# Patient Record
Sex: Male | Born: 1935 | Race: White | Hispanic: No | Marital: Married | Smoking: Never smoker
Health system: Southern US, Community
[De-identification: ages and names within clinical notes are randomized; demographics above are authoritative.]

## PROBLEM LIST (undated history)

## (undated) DIAGNOSIS — J61 Pneumoconiosis due to asbestos and other mineral fibers: Secondary | ICD-10-CM

## (undated) DIAGNOSIS — I2510 Atherosclerotic heart disease of native coronary artery without angina pectoris: Secondary | ICD-10-CM

## (undated) DIAGNOSIS — R42 Dizziness and giddiness: Secondary | ICD-10-CM

## (undated) DIAGNOSIS — N289 Disorder of kidney and ureter, unspecified: Secondary | ICD-10-CM

## (undated) DIAGNOSIS — E119 Type 2 diabetes mellitus without complications: Secondary | ICD-10-CM

## (undated) DIAGNOSIS — IMO0002 Reserved for concepts with insufficient information to code with codable children: Secondary | ICD-10-CM

## (undated) DIAGNOSIS — I428 Other cardiomyopathies: Secondary | ICD-10-CM

## (undated) DIAGNOSIS — M1990 Unspecified osteoarthritis, unspecified site: Secondary | ICD-10-CM

## (undated) DIAGNOSIS — I080 Rheumatic disorders of both mitral and aortic valves: Secondary | ICD-10-CM

## (undated) DIAGNOSIS — I472 Ventricular tachycardia: Secondary | ICD-10-CM

## (undated) DIAGNOSIS — H833X9 Noise effects on inner ear, unspecified ear: Secondary | ICD-10-CM

## (undated) DIAGNOSIS — I079 Rheumatic tricuspid valve disease, unspecified: Secondary | ICD-10-CM

## (undated) DIAGNOSIS — I4891 Unspecified atrial fibrillation: Secondary | ICD-10-CM

## (undated) DIAGNOSIS — G4733 Obstructive sleep apnea (adult) (pediatric): Secondary | ICD-10-CM

## (undated) DIAGNOSIS — K219 Gastro-esophageal reflux disease without esophagitis: Secondary | ICD-10-CM

## (undated) DIAGNOSIS — E039 Hypothyroidism, unspecified: Secondary | ICD-10-CM

## (undated) DIAGNOSIS — E785 Hyperlipidemia, unspecified: Secondary | ICD-10-CM

## (undated) HISTORY — DX: Ventricular tachycardia: I47.2

## (undated) HISTORY — DX: Unspecified osteoarthritis, unspecified site: M19.90

## (undated) HISTORY — DX: Other cardiomyopathies: I42.8

## (undated) HISTORY — DX: Pneumoconiosis due to asbestos and other mineral fibers: J61

## (undated) HISTORY — DX: Rheumatic disorders of both mitral and aortic valves: I08.0

## (undated) HISTORY — DX: Hypothyroidism, unspecified: E03.9

## (undated) HISTORY — DX: Dizziness and giddiness: R42

## (undated) HISTORY — DX: Obstructive sleep apnea (adult) (pediatric): G47.33

## (undated) HISTORY — DX: Unspecified atrial fibrillation: I48.91

## (undated) HISTORY — DX: Disorder of kidney and ureter, unspecified: N28.9

## (undated) HISTORY — DX: Hyperlipidemia, unspecified: E78.5

## (undated) HISTORY — DX: Atherosclerotic heart disease of native coronary artery without angina pectoris: I25.10

## (undated) HISTORY — DX: Reserved for concepts with insufficient information to code with codable children: IMO0002

## (undated) HISTORY — DX: Rheumatic tricuspid valve disease, unspecified: I07.9

## (undated) HISTORY — DX: Type 2 diabetes mellitus without complications: E11.9

## (undated) HISTORY — DX: Noise effects on inner ear, unspecified ear: H83.3X9

## (undated) HISTORY — DX: Gastro-esophageal reflux disease without esophagitis: K21.9

---

## 2009-11-18 ENCOUNTER — Ambulatory Visit: Payer: Self-pay | Admitting: Internal Medicine

## 2009-11-18 ENCOUNTER — Inpatient Hospital Stay (HOSPITAL_COMMUNITY): Admission: RE | Admit: 2009-11-18 | Discharge: 2009-11-28 | Payer: Self-pay | Admitting: Orthopedic Surgery

## 2009-11-18 ENCOUNTER — Ambulatory Visit: Payer: Self-pay | Admitting: Pulmonary Disease

## 2009-11-18 HISTORY — PX: OTHER SURGICAL HISTORY: SHX169

## 2009-11-20 ENCOUNTER — Encounter: Payer: Self-pay | Admitting: Internal Medicine

## 2009-11-20 ENCOUNTER — Ambulatory Visit: Payer: Self-pay | Admitting: Physical Medicine & Rehabilitation

## 2009-11-21 ENCOUNTER — Encounter: Payer: Self-pay | Admitting: Internal Medicine

## 2009-11-25 ENCOUNTER — Encounter: Payer: Self-pay | Admitting: Internal Medicine

## 2009-11-28 ENCOUNTER — Encounter: Payer: Self-pay | Admitting: Internal Medicine

## 2009-11-29 ENCOUNTER — Encounter: Payer: Self-pay | Admitting: Internal Medicine

## 2009-12-11 ENCOUNTER — Encounter: Payer: Self-pay | Admitting: Internal Medicine

## 2009-12-11 ENCOUNTER — Ambulatory Visit: Payer: Self-pay

## 2010-02-20 ENCOUNTER — Encounter: Payer: Self-pay | Admitting: Internal Medicine

## 2010-02-26 ENCOUNTER — Telehealth: Payer: Self-pay | Admitting: Internal Medicine

## 2010-02-27 ENCOUNTER — Encounter: Payer: Self-pay | Admitting: Internal Medicine

## 2010-03-03 DIAGNOSIS — J61 Pneumoconiosis due to asbestos and other mineral fibers: Secondary | ICD-10-CM | POA: Insufficient documentation

## 2010-03-03 DIAGNOSIS — M1990 Unspecified osteoarthritis, unspecified site: Secondary | ICD-10-CM

## 2010-03-03 DIAGNOSIS — N289 Disorder of kidney and ureter, unspecified: Secondary | ICD-10-CM | POA: Insufficient documentation

## 2010-03-03 DIAGNOSIS — I079 Rheumatic tricuspid valve disease, unspecified: Secondary | ICD-10-CM

## 2010-03-03 DIAGNOSIS — E785 Hyperlipidemia, unspecified: Secondary | ICD-10-CM

## 2010-03-03 DIAGNOSIS — I428 Other cardiomyopathies: Secondary | ICD-10-CM

## 2010-03-03 DIAGNOSIS — I2510 Atherosclerotic heart disease of native coronary artery without angina pectoris: Secondary | ICD-10-CM

## 2010-03-03 DIAGNOSIS — G4733 Obstructive sleep apnea (adult) (pediatric): Secondary | ICD-10-CM

## 2010-03-03 DIAGNOSIS — E119 Type 2 diabetes mellitus without complications: Secondary | ICD-10-CM

## 2010-03-03 DIAGNOSIS — I080 Rheumatic disorders of both mitral and aortic valves: Secondary | ICD-10-CM | POA: Insufficient documentation

## 2010-03-03 DIAGNOSIS — K219 Gastro-esophageal reflux disease without esophagitis: Secondary | ICD-10-CM | POA: Insufficient documentation

## 2010-03-03 DIAGNOSIS — I4891 Unspecified atrial fibrillation: Secondary | ICD-10-CM

## 2010-03-03 DIAGNOSIS — E039 Hypothyroidism, unspecified: Secondary | ICD-10-CM

## 2010-03-03 DIAGNOSIS — IMO0002 Reserved for concepts with insufficient information to code with codable children: Secondary | ICD-10-CM | POA: Insufficient documentation

## 2010-03-03 HISTORY — DX: Atherosclerotic heart disease of native coronary artery without angina pectoris: I25.10

## 2010-03-04 ENCOUNTER — Ambulatory Visit: Payer: Self-pay | Admitting: Internal Medicine

## 2010-03-04 DIAGNOSIS — Z95810 Presence of automatic (implantable) cardiac defibrillator: Secondary | ICD-10-CM

## 2010-03-07 ENCOUNTER — Encounter: Payer: Self-pay | Admitting: Internal Medicine

## 2010-03-13 ENCOUNTER — Encounter: Payer: Self-pay | Admitting: Internal Medicine

## 2010-03-19 ENCOUNTER — Encounter: Payer: Self-pay | Admitting: Internal Medicine

## 2010-03-24 ENCOUNTER — Ambulatory Visit: Payer: Self-pay | Admitting: Internal Medicine

## 2010-03-24 ENCOUNTER — Ambulatory Visit (HOSPITAL_COMMUNITY): Admission: RE | Admit: 2010-03-24 | Discharge: 2010-03-24 | Payer: Self-pay | Admitting: Internal Medicine

## 2010-03-24 ENCOUNTER — Encounter: Payer: Self-pay | Admitting: Internal Medicine

## 2010-03-28 ENCOUNTER — Encounter: Payer: Self-pay | Admitting: Internal Medicine

## 2010-05-05 ENCOUNTER — Inpatient Hospital Stay (HOSPITAL_COMMUNITY): Admission: RE | Admit: 2010-05-05 | Discharge: 2010-05-09 | Payer: Self-pay | Admitting: Orthopedic Surgery

## 2010-06-03 ENCOUNTER — Telehealth: Payer: Self-pay | Admitting: Internal Medicine

## 2010-06-04 ENCOUNTER — Encounter: Payer: Self-pay | Admitting: Internal Medicine

## 2010-06-05 ENCOUNTER — Ambulatory Visit: Payer: Self-pay | Admitting: Internal Medicine

## 2010-06-09 ENCOUNTER — Encounter: Payer: Self-pay | Admitting: Internal Medicine

## 2010-06-11 ENCOUNTER — Encounter: Payer: Self-pay | Admitting: Internal Medicine

## 2010-06-19 ENCOUNTER — Encounter: Payer: Self-pay | Admitting: Internal Medicine

## 2010-06-26 ENCOUNTER — Ambulatory Visit: Payer: Self-pay | Admitting: Internal Medicine

## 2010-06-26 DIAGNOSIS — I472 Ventricular tachycardia, unspecified: Secondary | ICD-10-CM

## 2010-06-26 DIAGNOSIS — I4729 Other ventricular tachycardia: Secondary | ICD-10-CM

## 2010-06-26 HISTORY — DX: Other ventricular tachycardia: I47.29

## 2010-06-26 HISTORY — DX: Ventricular tachycardia, unspecified: I47.20

## 2010-06-26 HISTORY — DX: Ventricular tachycardia: I47.2

## 2010-07-02 ENCOUNTER — Encounter: Payer: Self-pay | Admitting: Internal Medicine

## 2010-07-29 ENCOUNTER — Telehealth: Payer: Self-pay | Admitting: Internal Medicine

## 2010-09-09 ENCOUNTER — Ambulatory Visit: Payer: Self-pay | Admitting: Internal Medicine

## 2010-09-12 ENCOUNTER — Encounter: Payer: Self-pay | Admitting: Internal Medicine

## 2010-10-01 ENCOUNTER — Encounter: Payer: Self-pay | Admitting: Internal Medicine

## 2010-10-24 ENCOUNTER — Ambulatory Visit
Admission: RE | Admit: 2010-10-24 | Discharge: 2010-10-24 | Payer: Self-pay | Source: Home / Self Care | Attending: Internal Medicine | Admitting: Internal Medicine

## 2010-10-24 ENCOUNTER — Encounter: Payer: Self-pay | Admitting: Internal Medicine

## 2010-11-18 NOTE — Letter (Signed)
Summary: [facility] Progress Note  [facility] Progress Note   Imported By: Roderic Ovens 04/09/2010 16:00:10  _____________________________________________________________________  External Attachment:    Type:   Image     Comment:   External Document

## 2010-11-18 NOTE — Cardiovascular Report (Signed)
Summary: Office Visit Remote   Office Visit Remote   Imported By: Roderic Ovens 07/08/2010 13:55:23  _____________________________________________________________________  External Attachment:    Type:   Image     Comment:   External Document

## 2010-11-18 NOTE — Cardiovascular Report (Signed)
Summary: Office Visit   Office Visit   Imported By: Roderic Ovens 07/07/2010 15:44:24  _____________________________________________________________________  External Attachment:    Type:   Image     Comment:   External Document

## 2010-11-18 NOTE — Letter (Signed)
Summary: [facility]  [facility]   Imported By: Marylou Mccoy 07/10/2010 16:05:42  _____________________________________________________________________  External Attachment:    Type:   Image     Comment:   External Document

## 2010-11-18 NOTE — Assessment & Plan Note (Signed)
Summary: pc2/jml   Visit Type:  Pacemaker check Primary Provider:  vICKY iNGLEDUE  CC:  EDEMA/FEET/HANDS.  History of Present Illness: Jeremy Cobb returns today for followup.  He is a 77 yo man from [place], [place] who has a h/o knee replacement complicated by sustained VT arrest for which he was successfully defibrillated.  He is s/p ICD and returns for followup.  He denies c/p or sob.  He is considering another knee replacement surgery.  He denies c/p but does admit to sob with exertion.  No intercurrent ICD therapies.  Problems Prior to Update: 1)  Coronary Artery Disease  (ICD-414.00) 2)  Atrial Fibrillation  (ICD-427.31) 3)  Cardiomyopathy  (ICD-425.4) 4)  Tricuspid Regurgitation, Mild  (ICD-397.0) 5)  Mitral Regurgitation, Mild  (ICD-396.3) 6)  Hyperlipidemia  (ICD-272.4) 7)  Gerd  (ICD-530.81) 8)  Diabetes Mellitus, Type II  (ICD-250.00) 9)  Renal Disease, Chronic  (ICD-593.9) 10)  Hypothyroidism  (ICD-244.9) 11)  Obstructive Sleep Apnea  (ICD-327.23) 12)  Osteoarthritis  (ICD-715.90) 13)  Asbestosis  (ICD-501) 14)  Degenerative Disc Disease  (ICD-722.6)  Current Medications (verified): 1)  Aspirin 81 Mg Tbec (Aspirin) .... Take One Tablet By Mouth Daily 2)  Amiodarone Hcl 200 Mg Tabs (Amiodarone Hcl) .... Two By Mouth Daily 3)  Digoxin 0.125 Mg Tabs (Digoxin) .... One By Mouth Daily 4)  Furosemide 20 Mg Tabs (Furosemide) .... One By Mouth Daily 5)  Simvastatin 80 Mg Tabs (Simvastatin) .Marland Kitchen.. 1 Tab At Bedtime 6)  Aspir-Low 81 Mg Tbec (Aspirin) .... One By Mouth Daily 7)  Glipizide Xl 5 Mg Xr24h-Tab (Glipizide) .... One By Mouth Two Times A Day 8)  Hydrocodone-Acetaminophen 5-325 Mg Tabs (Hydrocodone-Acetaminophen) .... As Needed 9)  Levothroid 25 Mcg Tabs (Levothyroxine Sodium) .... One By Mouth Daily 10)  Omeprazole 40 Mg Cpdr (Omeprazole) .... One By Mouth Two Times A Day 11)  Klor-Con 20 Meq Pack (Potassium Chloride) .... One By Mouth Daily 12)  Warfarin Sodium 2 Mg Tabs  (Warfarin Sodium) .... As Directed  Allergies (verified): No Known Drug Allergies  Family History: n/c  Social History: Married no ETOH or Tobacco abuse.  Review of Systems  The patient denies chest pain, syncope, dyspnea on exertion, and peripheral edema.    Vital Signs:  Patient profile:   77 year old male Height:      72 inches Weight:      219 pounds BMI:     29.81 Pulse rate:   82 / minute Pulse rhythm:   regular BP sitting:   140 / 90  (left arm) Cuff size:   regular  Vitals Entered By: Danielle Rankin, CMA (Mar 04, 2010 12:15 PM)  Physical Exam  General:  Well developed, well nourished, in no acute distress.  HEENT: normal Neck: supple. No JVD. Carotids 2+ bilaterally no bruits Cor:I IRRR no rubs, gallops or murmur Lungs: CTA with no wheezes or rhonchi.  Well healed ICD incision Ab: soft, nontender. nondistended. No HSM. Good bowel sounds Ext: warm. no cyanosis, clubbing or edema Neuro: alert and oriented. Grossly nonfocal. affect pleasant     ICD Specifications Following MD:  Lewayne Bunting, MD     ICD Vendor:  Medtronic     ICD Model Number:  D284DRG     ICD Serial Number:  [id number] ICD DOI:  11/27/2009     ICD Implanting MD:  Lewayne Bunting, MD  Lead 1:    Location: RA     DOI: 11/27/2009     Model #:  [id number]     Serial #: [id number]     Status: active Lead 2:    Location: RV     DOI: 11/27/2009     Model #: [id number]     Serial #: [id number]     Status: active  Indications::  VT arrest   ICD Follow Up Remote Check?  No Battery Voltage:  3.18 V     Charge Time:  8.2 seconds     Underlying rhythm:  A-fib ICD Dependent:  No       ICD Device Measurements Atrium:  Amplitude: 0.8 mV, Impedance: 646 ohms,  Right Ventricle:  Amplitude: 10.3 mV, Impedance: 627 ohms, Threshold: 1.0 V at 0.4 msec Shock Impedance: 46/61 ohms   Episodes MS Episodes:  1     Percent Mode Switch:  100%     Coumadin:  Yes Shock:  0     ATP:  0     Nonsustained:  0     Atrial Pacing:  0.3%      Ventricular Pacing:  37.8%  Brady Parameters Mode DDD     Lower Rate Limit:  40     Upper Rate Limit 130 PAV 180     Sensed AV Delay:  150  Tachy Zones VF:  200     VT:  240     VT1:  176     Next Remote Date:  06/05/2010     Next Cardiology Appt Due:  11/19/2010 Tech Comments:  RV 2.4@0 .4.  A-fib today, + coumadin, ventricular rates controlled.  [facility] transmissions every 3 months.  RO 2/12 with Dr. Ladona Ridgel. Altha Harm, LPN  Mar 04, 2010 12:40 PM  MD Comments:  Agree with above.  Impression & Recommendations:  Problem # 1:  ATRIAL FIBRILLATION (ICD-427.31) The patient has symptomatic atrial fibrillation.  I have recommended that the patient increase his amiodarone to 400 mg daily and we schedue DCCV once we have documented 3 weeks of therapeutic INR's. His updated medication list for this problem includes:    Aspirin 81 Mg Tbec (Aspirin) .Marland Kitchen... Take one tablet by mouth daily    Amiodarone Hcl 200 Mg Tabs (Amiodarone hcl) .Marland Kitchen..Marland Kitchen Two by mouth daily    Digoxin 0.125 Mg Tabs (Digoxin) ..... One by mouth daily    Aspir-low 81 Mg Tbec (Aspirin) ..... One by mouth daily    Warfarin Sodium 2 Mg Tabs (Warfarin sodium) .Marland Kitchen... As directed  Problem # 2:  CORONARY ARTERY DISEASE (ICD-414.00) He denies anginal symptoms.  Will continue current meds. His updated medication list for this problem includes:    Aspirin 81 Mg Tbec (Aspirin) .Marland Kitchen... Take one tablet by mouth daily    Aspir-low 81 Mg Tbec (Aspirin) ..... One by mouth daily    Warfarin Sodium 2 Mg Tabs (Warfarin sodium) .Marland Kitchen... As directed  Problem # 3:  AUTOMATIC IMPLANTABLE CARDIAC DEFIBRILLATOR SITU (ICD-V45.02) His device is working normally and he has not had any additional VT.  Will followup.  Problem # 4:  UNSPECIFIED PRE-OPERATIVE EXAMINATION (ICD-V72.84) I have discussed his pending knee replacement surgery.  Overall he is low risk.  I have discussed proceeding with surgery and think he would be better off if he were in  NSR.  Patient Instructions: 1)  Your physician has recommended that you have a cardioversion (DCCV).  Electrical cardioversion uses a jolt of electricity to your heart either through paddles or wired patches attached to your chest. This is a controlled, usually prescheduled, procedure. Defibrillation is done under  light anesthesia in the hospital, and you usually go home the day of the procedure. This is done to get your heart back into a normal rhythm. You are not awake for the procedure. Please see the instruction sheet given to you today. 2)  Will set up for 03/24/10 around 2pm  Will have to be at the hospital at 11:30am Nothing to eat or drink after midnight 3)  Your physician has recommended you make the following change in your medication: increase your Amiodarone to 200mg  2 tablets daily

## 2010-11-18 NOTE — Procedures (Signed)
Summary: wound check   Current Medications (verified): 1)  Apap 325 Mg Tabs (Acetaminophen) .... As Needed 2)  Amiodarone Hcl 200 Mg Tabs (Amiodarone Hcl) .... Two By Mouth Daily 3)  Digoxin 0.125 Mg Tabs (Digoxin) .... One By Mouth Daily 4)  Furosemide 20 Mg Tabs (Furosemide) .... One By Mouth Daily 5)  Crestor 40 Mg Tabs (Rosuvastatin Calcium) .... One By Mouth Daily 6)  Aspir-Low 81 Mg Tbec (Aspirin) .... One By Mouth Daily 7)  Glipizide Xl 5 Mg Xr24h-Tab (Glipizide) .... One By Mouth Two Times A Day 8)  Hydrocodone-Acetaminophen 5-325 Mg Tabs (Hydrocodone-Acetaminophen) .... As Needed 9)  Levothroid 25 Mcg Tabs (Levothyroxine Sodium) .... One By Mouth Daily 10)  Omeprazole 40 Mg Cpdr (Omeprazole) .... One By Mouth Two Times A Day 11)  Klor-Con 20 Meq Pack (Potassium Chloride) .... One By Mouth Daily  Allergies (verified): No Known Drug Allergies   ICD Specifications Following MD:  Lewayne Bunting, MD     ICD Vendor:  Medtronic     ICD Model Number:  D284DRG     ICD Serial Number:  [id number] ICD DOI:  11/27/2009     ICD Implanting MD:  Lewayne Bunting, MD  Lead 1:    Location: RA     DOI: 11/27/2009     Model #: 2536     Serial #: [id number]     Status: active Lead 2:    Location: RV     DOI: 11/27/2009     Model #: 9563     Serial #: [id number]     Status: active  Indications::  VT arrest   ICD Follow Up Remote Check?  No Battery Voltage:  3.17 V     Charge Time:  8.2 seconds     Underlying rhythm:  A-fib ICD Dependent:  No       ICD Device Measurements Atrium:  Amplitude: 2.0 mV, Impedance: 627 ohms,  Right Ventricle:  Amplitude: 13.5 mV, Impedance: 532 ohms, Threshold: 0.75 V at 0.4 msec Shock Impedance: 41/52 ohms   Episodes Percent Mode Switch:  100%     Coumadin:  Yes Shock:  0     ATP:  0     Nonsustained:  0     Atrial Pacing:  0.2%     Ventricular Pacing:  80.1%  Brady Parameters Mode DDD     Lower Rate Limit:  40     Upper Rate Limit 130 PAV 180     Sensed AV  Delay:  150  Tachy Zones VF:  200     VT:  240     VT1:  176     Tech Comments:  Steri strips removed, no redness, small amt. edema noted.  Activity restrictions and device discharge instructions reviewed with the patient and he is in agreement.  No parameter changes.  A-fib with controlled ventricular rates, + coumadin not followed by [place].  ROV 3 months Dr. Ladona Ridgel. Altha Harm, LPN  December 11, 2009 12:28 PM

## 2010-11-18 NOTE — Consult Note (Signed)
Summary: [facility]   Imported By: Roderic Ovens 12/09/2009 14:24:15  _____________________________________________________________________  External Attachment:    Type:   Image     Comment:   External Document

## 2010-11-18 NOTE — Progress Notes (Signed)
Summary: refill  Phone Note Refill Request Message from:  Patient on Feb 26, 2010 9:23 AM  Refills Requested: Medication #1:  DIGOXIN 0.125 MG TABS one by mouth daily  Medication #2:  FUROSEMIDE 20 MG TABS one by mouth daily Send to [facility] [phone]  Initial call taken by: Judie Grieve,  Feb 26, 2010 9:24 AM    Prescriptions: FUROSEMIDE 20 MG TABS (FUROSEMIDE) one by mouth daily  #30 x 6   Entered by:   Laurance Flatten CMA   Authorized by:   Laren Boom, MD, [facility]   Signed by:   Laurance Flatten CMA on 02/26/2010   Method used:   Print then Give to Patient   RxID:   [id number] DIGOXIN 0.125 MG TABS (DIGOXIN) one by mouth daily  #30 x 6   Entered by:   Laurance Flatten CMA   Authorized by:   Laren Boom, MD, [facility]   Signed by:   Laurance Flatten CMA on 02/26/2010   Method used:   Print then Give to Patient   RxID:   [id number]   Appended Document: refill Called both Rx in to pharmacy.

## 2010-11-18 NOTE — Cardiovascular Report (Signed)
Summary: Office Visit   [facility]   Imported By: Roderic Ovens 12/16/2009 12:43:54  _____________________________________________________________________  External Attachment:    Type:   Image     Comment:   External Document

## 2010-11-18 NOTE — Assessment & Plan Note (Signed)
Summary: rov   Visit Type:  Follow-up Primary Provider:  vICKY iNGLEDUE   History of Present Illness: Jeremy Cobb returns today for followup.  He is a 77 yo man from [place], [place] who has a h/o knee replacement complicated by sustained VT arrest for which he was successfully defibrillated.  He is s/p ICD and returns for followup.  He denies c/p but does admit to sob with exertion.  No intercurrent ICD therapies.  The patient is interested in switching from coumadin to pradaxa.  Current Medications (verified): 1)  Aspirin 81 Mg Tbec (Aspirin) .... Take One Tablet By Mouth Daily 2)  Amiodarone Hcl 200 Mg Tabs (Amiodarone Hcl) .... Two By Mouth Daily 3)  Digoxin 0.125 Mg Tabs (Digoxin) .... One By Mouth Daily 4)  Furosemide 20 Mg Tabs (Furosemide) .... One By Mouth Daily 5)  Crestor 40 Mg Tabs (Rosuvastatin Calcium) .... At Bedtime 6)  Glipizide Xl 5 Mg Xr24h-Tab (Glipizide) .... One By Mouth Two Times A Day 7)  Oxycodone-Acetaminophen 5-325 Mg Tabs (Oxycodone-Acetaminophen) .... Uad 8)  Levothroid 50 Mcg Tabs (Levothyroxine Sodium) .... Once Daily 9)  Omeprazole 40 Mg Cpdr (Omeprazole) .... One By Mouth Two Times A Day 10)  Klor-Con 20 Meq Pack (Potassium Chloride) .... 1/2 Tab  By Mouth Daily 11)  Warfarin Sodium 2 Mg Tabs (Warfarin Sodium) .... As Directed 12)  Docusate Sodium 100 Mg Caps (Docusate Sodium) .... Two Times A Day  Allergies (verified): No Known Drug Allergies  Past History:  Past Medical History: Last updated: 03/03/2010 Current Problems:  CORONARY ARTERY DISEASE (ICD-414.00)- Nonobstructive ATRIAL FIBRILLATION (ICD-427.31) CARDIOMYOPATHY (ICD-425.4)- Presumed Nonischemic TRICUSPID REGURGITATION, MILD (ICD-397.0) MITRAL REGURGITATION, MILD (ICD-396.3) HYPERLIPIDEMIA (ICD-272.4) GERD (ICD-530.81) DIABETES MELLITUS, TYPE II (ICD-250.00) RENAL DISEASE, CHRONIC (ICD-593.9) HYPOTHYROIDISM (ICD-244.9) OBSTRUCTIVE SLEEP APNEA (ICD-327.23) OSTEOARTHRITIS  (ICD-715.90) ASBESTOSIS (ICD-501) DEGENERATIVE DISC DISEASE (ICD-722.6)  Past Surgical History: Last updated: 03/03/2010  Right total knee arthroplasty November 18, 2009 l placement of a Medtronic Maximo II DR dual-chamber AICD    serial number [id number].   Review of Systems  The patient denies chest pain, syncope, dyspnea on exertion, and peripheral edema.    Vital Signs:  Patient profile:   77 year old male Height:      72 inches Weight:      224 pounds BMI:     30.49 Pulse rate:   58 / minute BP sitting:   138 / 80  (left arm)  Vitals Entered By: Jeremy Cobb CMA (June 26, 2010 2:58 PM)  Physical Exam  General:  Well developed, well nourished, in no acute distress.  HEENT: normal Neck: supple. No JVD. Carotids 2+ bilaterally no bruits Cor:I IRRR no rubs, gallops or murmur Lungs: CTA with no wheezes or rhonchi.  Well healed ICD incision Ab: soft, nontender. nondistended. No HSM. Good bowel sounds Ext: warm. no cyanosis, clubbing or edema Neuro: alert and oriented. Grossly nonfocal. affect pleasant     ICD Specifications Following MD:  Lewayne Bunting, MD     ICD Vendor:  Medtronic     ICD Model Number:  D284DRG     ICD Serial Number:  [id number] ICD DOI:  11/27/2009     ICD Implanting MD:  Lewayne Bunting, MD  Lead 1:    Location: RA     DOI: 11/27/2009     Model #: 6962     Serial #: [id number]     Status: active Lead 2:    Location: RV     DOI: 11/27/2009  Model #: [id number]     Serial #: [id number]     Status: active  Indications::  VT arrest   ICD Follow Up Battery Voltage:  3.17 V     Charge Time:  8.1 seconds     Underlying rhythm:  SR ICD Dependent:  No       ICD Device Measurements Atrium:  Amplitude: 2.6 mV, Impedance: 532 ohms, Threshold: 1.00 V at 0.40 msec Right Ventricle:  Amplitude: 12.0 mV, Impedance: 532 ohms, Threshold: 0.75 V at 0.40 msec Shock Impedance: 40/54 ohms   Episodes MS Episodes:  0     Coumadin:  Yes Shock:  0     ATP:  0      Nonsustained:  0     Atrial Therapies:  0 Atrial Pacing:  <0.1%     Ventricular Pacing:  <0.1%  Brady Parameters Mode MVP     Lower Rate Limit:  40     Upper Rate Limit 130 PAV 180     Sensed AV Delay:  150  Tachy Zones VF:  200     VT:  240     VT1:  176     Next Remote Date:  10/02/2010     Next Cardiology Appt Due:  11/19/2010 Tech Comments:  NORMAL DEVICE FUNCTION. NO EPISODES SINCE LAST CHECK.  TURNED ON 1:1 SVT AND CHANGED MAX LEAD IMPEDANCE FOR LIA. CHANGED RA OUTPUT FROM 3.50 TO 2.00 V.  CARELINK TRANSMISSION 10-02-10. ROV IN FEB 2012 W/GT. Jeremy Cobb  June 26, 2010 3:27 PM MD Comments:  Agree with above.  Impression & Recommendations:  Problem # 1:  AUTOMATIC IMPLANTABLE CARDIAC DEFIBRILLATOR SITU (ICD-V45.02) His device is working normally.  Will recheck in several months.  Problem # 2:  VENTRICULAR TACHYCARDIA, PAROXYSMAL (ICD-427.1) He has had no additional ventricular arrhythmias on amiodarone.  Today I have asked him to decrease his dose to 300 mg daily.  Additional decrements will follow provided he maintains NSR. The following medications were removed from the medication list:    Aspir-low 81 Mg Tbec (Aspirin) ..... One by mouth daily His updated medication list for this problem includes:    Aspirin 81 Mg Tbec (Aspirin) .Marland Kitchen... Take one tablet by mouth daily    Amiodarone Hcl 200 Mg Tabs (Amiodarone hcl) ..... One and a half pills by mouth daily (150mg  daily)    Warfarin Sodium 2 Mg Tabs (Warfarin sodium) .Marland Kitchen... As directed (stop 4 days before dental procedure)  Problem # 3:  ATRIAL FIBRILLATION (ICD-427.31) He continues to maintain NSR since his cardioversion.  Continue meds as below and will plan to switch to Pradaxa. The following medications were removed from the medication list:    Aspir-low 81 Mg Tbec (Aspirin) ..... One by mouth daily His updated medication list for this problem includes:    Aspirin 81 Mg Tbec (Aspirin) .Marland Kitchen... Take one tablet by mouth  daily    Amiodarone Hcl 200 Mg Tabs (Amiodarone hcl) ..... One and a half pills by mouth daily (150mg  daily)    Digoxin 0.125 Mg Tabs (Digoxin) ..... One by mouth daily    Warfarin Sodium 2 Mg Tabs (Warfarin sodium) .Marland Kitchen... As directed (stop 4 days before dental procedure)  Patient Instructions: 1)  Your physician has recommended you make the following change in your medication: Stop coumadin 4 days before dental work. Start Pradaxa after dental work. Take Pradaxa 150mg  one pill-two times a day.  Decrease Amiodorane to 1 and 1/2 pills a day (150mg ).  2)  Your physician wants you to follow-up in:  4 months.  You will receive a reminder letter in the mail two months in advance. If you don't receive a letter, please call our office to schedule the follow-up appointment. Prescriptions: PRADAXA 150 MG CAPS (DABIGATRAN ETEXILATE MESYLATE) one tablet twice a day  #180 x 3   Entered by:   Jeremy Gladden RN   Authorized by:   Laren Boom, MD, [facility]   Signed by:   Jeremy Gladden RN on 06/26/2010   Method used:   Print then Give to Patient   RxID:   [id number]

## 2010-11-18 NOTE — Progress Notes (Signed)
Summary: need clearance to have tooth pulled  Phone Note Call from Patient Call back at [phone]    Summary of Call: Pt need dental work and need clearance Initial call taken by: Judie Grieve,  July 29, 2010 10:09 AM  Follow-up for Phone Call        pt will stop Pradaxa 3 days prior to tooth extraction per DrTaylor.  Dr Ophelia Charter office aware Dennis Bast, RN, BSN  July 29, 2010 2:07 PM

## 2010-11-18 NOTE — Miscellaneous (Signed)
Summary: Device preload  Clinical Lists Changes  Observations: Added new observation of ICD INDICATN: VT arrest (11/29/2009 8:45) Added new observation of ICDLEADSTAT2: active (11/29/2009 8:45) Added new observation of ICDLEADSER2: [id number] V (11/29/2009 8:45) Added new observation of ICDLEADMOD2: 6947  (11/29/2009 8:45) Added new observation of ICDLEADLOC2: RV  (11/29/2009 8:45) Added new observation of ICDLEADSTAT1: active  (11/29/2009 8:45) Added new observation of ICDLEADSER1: [id number]  (11/29/2009 8:45) Added new observation of ICDLEADMOD1: 5076  (11/29/2009 8:45) Added new observation of ICDLEADLOC1: RA  (11/29/2009 8:45) Added new observation of ICDLEADDOI2: 11/27/2009  (11/29/2009 8:45) Added new observation of ICDLEADDOI1: 11/27/2009  (11/29/2009 8:45) Added new observation of ICD IMP MD: Jeremy Bunting, MD  (11/29/2009 8:45) Added new observation of ICD IMPL DTE: 11/27/2009  (11/29/2009 8:45) Added new observation of ICD SERL#: [id number] H  (11/29/2009 8:45) Added new observation of ICD MODL#: D284DRG  (11/29/2009 8:45) Added new observation of ICDMANUFACTR: Medtronic  (11/29/2009 8:45) Added new observation of ICD MD: Jeremy Bunting, MD  (11/29/2009 8:45)       ICD Specifications Following MD:  Jeremy Bunting, MD     ICD Vendor:  Medtronic     ICD Model Number:  D284DRG     ICD Serial Number:  [id number] ICD DOI:  11/27/2009     ICD Implanting MD:  Jeremy Bunting, MD  Lead 1:    Location: RA     DOI: 11/27/2009     Model #: 4010     Serial #: [id number]     Status: active Lead 2:    Location: RV     DOI: 11/27/2009     Model #: 3474     Serial #: [id number]     Status: active  Indications::  VT arrest

## 2010-11-18 NOTE — Letter (Signed)
Summary: Remote Device Check  [employer], Main Office  [address]   [place], [place] [address]   Phone: [phone]  Fax: [phone]     July 02, 2010 MRN: [id number]   Jeremy Cobb [address] [place], [place]  [address]   Dear [name],   Your remote transmission was recieved and reviewed by your physician.  All diagnostics were within normal limits for you.  __X___Your next transmission is scheduled for:  09-11-10.  Please transmit at any time this day.  If you have a wireless device your transmission will be sent automatically.   Sincerely,  Vella Kohler

## 2010-11-18 NOTE — Letter (Signed)
Summary: [facility]   [facility]   Imported By: Roderic Ovens 04/14/2010 12:20:17  _____________________________________________________________________  External Attachment:    Type:   Image     Comment:   External Document

## 2010-11-18 NOTE — Progress Notes (Signed)
Summary: pt rtn call from yesterday  Phone Note Outgoing Call Call back at [facility] Phone [phone]   Caller: Patient Reason for Call: Talk to Nurse, Talk to Doctor Summary of Call: pt rtn call from yesterday regarding defib changes and he just wants to confirm time so he will be near the machine Initial call taken by: Omer Jack,  June 03, 2010 11:52 AM Summary of Call: spoke w/pt about remote check. pt understands how to send transmission. pt to call if any problems occur. Vella Kohler  June 03, 2010 3:11 PM

## 2010-11-20 NOTE — Assessment & Plan Note (Signed)
Summary: f68m   Visit Type:  4 month follow up Primary Provider:  vICKY iNGLEDUE  CC:  dizziness and swelling in left knee.  History of Present Illness: [name] returns today for followup.  He is a 77 yo man from [place], [place] who has a h/o knee replacement complicated by sustained VT arrest for which he was successfully defibrillated.  He is s/p ICD and returns for followup.  He denies c/p but does admit to sob with exertion. He also has some dizziness.  No intercurrent ICD therapies.  No palpitations.  Problems Prior to Update: 1)  Ventricular Tachycardia, Paroxysmal  (ICD-427.1) 2)  Unspecified Pre-operative Examination  (ICD-V72.84) 3)  Automatic Implantable Cardiac Defibrillator Situ  (ICD-V45.02) 4)  Coronary Artery Disease  (ICD-414.00) 5)  Atrial Fibrillation  (ICD-427.31) 6)  Cardiomyopathy  (ICD-425.4) 7)  Tricuspid Regurgitation, Mild  (ICD-397.0) 8)  Mitral Regurgitation, Mild  (ICD-396.3) 9)  Hyperlipidemia  (ICD-272.4) 10)  Gerd  (ICD-530.81) 11)  Diabetes Mellitus, Type II  (ICD-250.00) 12)  Renal Disease, Chronic  (ICD-593.9) 13)  Hypothyroidism  (ICD-244.9) 14)  Obstructive Sleep Apnea  (ICD-327.23) 15)  Osteoarthritis  (ICD-715.90) 16)  Asbestosis  (ICD-501) 17)  Degenerative Disc Disease  (ICD-722.6)  Current Medications (verified): 1)  Aspirin 81 Mg Tbec (Aspirin) .... Take One Tablet By Mouth Daily 2)  Amiodarone Hcl 200 Mg Tabs (Amiodarone Hcl) .... One and A Half Pills By Mouth Daily (150mg  Daily) 3)  Digoxin 0.125 Mg Tabs (Digoxin) .... One By Mouth Daily 4)  Furosemide 20 Mg Tabs (Furosemide) .... One By Mouth Daily 5)  Crestor 40 Mg Tabs (Rosuvastatin Calcium) .... At Bedtime 6)  Glipizide Xl 5 Mg Xr24h-Tab (Glipizide) .... One By Mouth Two Times A Day 7)  Oxycodone-Acetaminophen 5-325 Mg Tabs (Oxycodone-Acetaminophen) .... Uad 8)  Levothroid 50 Mcg Tabs (Levothyroxine Sodium) .... Once Daily 9)  Omeprazole 40 Mg Cpdr (Omeprazole) .... One By Mouth  Two Times A Day 10)  Klor-Con 20 Meq Pack (Potassium Chloride) .... 1/2 Tab  By Mouth Daily 11)  Docusate Sodium 100 Mg Caps (Docusate Sodium) .... Two Times A Day 12)  Pradaxa 150 Mg Caps (Dabigatran Etexilate Mesylate) .... One Tablet Twice A Day  Allergies (verified): No Known Drug Allergies  Past History:  Past Medical History: Last updated: 03/03/2010 Current Problems:  CORONARY ARTERY DISEASE (ICD-414.00)- Nonobstructive ATRIAL FIBRILLATION (ICD-427.31) CARDIOMYOPATHY (ICD-425.4)- Presumed Nonischemic TRICUSPID REGURGITATION, MILD (ICD-397.0) MITRAL REGURGITATION, MILD (ICD-396.3) HYPERLIPIDEMIA (ICD-272.4) GERD (ICD-530.81) DIABETES MELLITUS, TYPE II (ICD-250.00) RENAL DISEASE, CHRONIC (ICD-593.9) HYPOTHYROIDISM (ICD-244.9) OBSTRUCTIVE SLEEP APNEA (ICD-327.23) OSTEOARTHRITIS (ICD-715.90) ASBESTOSIS (ICD-501) DEGENERATIVE DISC DISEASE (ICD-722.6)  Past Surgical History: Last updated: 03/03/2010  Right total knee arthroplasty November 18, 2009 l placement of a Medtronic Maximo II DR dual-chamber AICD    serial number [id number].   Family History: Last updated: 03/04/2010 n/c  Social History: Last updated: 03/04/2010 Married no ETOH or Tobacco abuse.  Review of Systems  The patient denies chest pain, syncope, dyspnea on exertion, and peripheral edema.    Vital Signs:  Patient profile:   77 year old male Height:      72 inches Weight:      233.50 pounds BMI:     31.78 Pulse rate:   63 / minute BP sitting:   164 / 79  (left arm) Cuff size:   regular  Vitals Entered By: Caralee Ates CMA (October 24, 2010 1:15 PM)  Physical Exam  General:  Well developed, well nourished, in no acute distress.  HEENT: normal Neck: supple. No JVD. Carotids 2+ bilaterally no bruits Cor:I IRRR no rubs, gallops or murmur Lungs: CTA with no wheezes or rhonchi.  Well healed ICD incision Ab: soft, nontender. nondistended. No HSM. Good bowel sounds Ext: warm. no cyanosis, clubbing  or edema Neuro: alert and oriented. Grossly nonfocal. affect pleasant     ICD Specifications Following MD:  Lewayne Bunting, MD     ICD Vendor:  Medtronic     ICD Model Number:  [id number]     ICD Serial Number:  [id number] ICD DOI:  11/27/2009     ICD Implanting MD:  Lewayne Bunting, MD  Lead 1:    Location: RA     DOI: 11/27/2009     Model #: 1914     Serial #: [id number]     Status: active Lead 2:    Location: RV     DOI: 11/27/2009     Model #: [id number]     Serial #: [id number]     Status: active  Indications::  VT arrest   ICD Follow Up Battery Voltage:  3.17 V     Charge Time:  8.1 seconds     Underlying rhythm:  SR ICD Dependent:  No       ICD Device Measurements Atrium:  Amplitude: 2.9 mV, Impedance: 551 ohms, Threshold: 1.00 V at 0.40 msec Right Ventricle:  Amplitude: 12.8 mV, Impedance: 551 ohms, Threshold: 0.75 V at 0.40 msec Shock Impedance: 45/59 ohms   Episodes MS Episodes:  0     Percent Mode Switch:  0     Coumadin:  Yes Shock:  0     ATP:  0     Nonsustained:  0     Atrial Therapies:  0 Atrial Pacing:  <0.1%     Ventricular Pacing:  <0.1%  Brady Parameters Mode MVP     Lower Rate Limit:  40     Upper Rate Limit 130 PAV 180     Sensed AV Delay:  150  Tachy Zones VF:  200     VT:  240     VT1:  176     Next Remote Date:  01/22/2011     Next Cardiology Appt Due:  10/20/2011 Tech Comments:  NORMAL DEVICE FUNCTION.  NO EPISODES SINCE LAST CHECK. DEMONSTRATED TONES FOR PT AND PT AWARE TO CALL OFC IF HEARD.  NO CHANGES MADE. CARELINK 01-22-11 AND ROV 12 MTHS W/GT. Vella Kohler  October 24, 2010 1:34 PM MD Comments:  Agree with above.  Impression & Recommendations:  Problem # 1:  VENTRICULAR TACHYCARDIA, PAROXYSMAL (ICD-427.1) He has had no recurrent symptoms.  Will continue current meds and reduce amio to 200 mg a day. The following medications were removed from the medication list:    Warfarin Sodium 2 Mg Tabs (Warfarin sodium) .Marland Kitchen... As directed (stop 4 days before dental  procedure) His updated medication list for this problem includes:    Aspirin 81 Mg Tbec (Aspirin) .Marland Kitchen... Take one tablet by mouth daily    Amiodarone Hcl 200 Mg Tabs (Amiodarone hcl) .Marland Kitchen... Take one tablet by mouth once daily  Problem # 2:  AUTOMATIC IMPLANTABLE CARDIAC DEFIBRILLATOR SITU (ICD-V45.02) His device is working normally. Will recheck in several months.  Problem # 3:  ATRIAL FIBRILLATION (ICD-427.31) He continue to maintain NSR. He will remain on Pradaxa. The following medications were removed from the medication list:    Warfarin Sodium 2 Mg Tabs (Warfarin sodium) .Marland Kitchen... As directed (stop 4  days before dental procedure) His updated medication list for this problem includes:    Aspirin 81 Mg Tbec (Aspirin) .Marland Kitchen... Take one tablet by mouth daily    Amiodarone Hcl 200 Mg Tabs (Amiodarone hcl) .Marland Kitchen... Take one tablet by mouth once daily    Digoxin 0.125 Mg Tabs (Digoxin) ..... One by mouth daily  Patient Instructions: 1)  Decrease amiodarone to 200mg  one tablet by mouth once daily. 2)  Your physician wants you to follow-up in: 6 months with Dr. Ladona Ridgel.  You will receive a reminder letter in the mail two months in advance. If you don't receive a letter, please call our office to schedule the follow-up appointment.

## 2010-11-20 NOTE — Cardiovascular Report (Signed)
Summary: Office Visit Remote   Office Visit Remote   Imported By: Roderic Ovens 10/02/2010 14:16:22  _____________________________________________________________________  External Attachment:    Type:   Image     Comment:   External Document

## 2010-11-20 NOTE — Cardiovascular Report (Signed)
Summary: Office Visit   Office Visit   Imported By: Roderic Ovens 10/28/2010 13:47:15  _____________________________________________________________________  External Attachment:    Type:   Image     Comment:   External Document

## 2010-11-20 NOTE — Letter (Signed)
Summary: Remote Device Check  [facility], Main Office  [address]   [place], [place] [address]   Phone: [phone]  Fax: [phone]     October 01, 2010 MRN: [id number]   JIMMIE RUETER [address] [place], [place]  [address]   Dear Mr. BINFORD,   Your remote transmission was recieved and reviewed by your physician.  All diagnostics were within normal limits for you.  __X____Your next office visit is scheduled for:  10-24-10 @ 330 with Dr Ladona Ridgel.    Sincerely,  Vella Kohler

## 2011-01-03 LAB — CBC
HCT: 28.9 % — ABNORMAL LOW (ref 39.0–52.0)
HCT: 33 % — ABNORMAL LOW (ref 39.0–52.0)
Hemoglobin: 10.9 g/dL — ABNORMAL LOW (ref 13.0–17.0)
MCH: 31.2 pg (ref 26.0–34.0)
MCHC: 33.2 g/dL (ref 30.0–36.0)
MCHC: 33.8 g/dL (ref 30.0–36.0)
MCV: 92.3 fL (ref 78.0–100.0)
MCV: 93 fL (ref 78.0–100.0)
Platelets: 114 10*3/uL — ABNORMAL LOW (ref 150–400)
RBC: 3.23 MIL/uL — ABNORMAL LOW (ref 4.22–5.81)
RDW: 16.1 % — ABNORMAL HIGH (ref 11.5–15.5)
RDW: 16.1 % — ABNORMAL HIGH (ref 11.5–15.5)
RDW: 16.5 % — ABNORMAL HIGH (ref 11.5–15.5)

## 2011-01-03 LAB — DIGOXIN LEVEL: Digoxin Level: 0.7 ng/mL — ABNORMAL LOW (ref 0.8–2.0)

## 2011-01-03 LAB — BASIC METABOLIC PANEL
BUN: 11 mg/dL (ref 6–23)
BUN: 13 mg/dL (ref 6–23)
CO2: 30 mEq/L (ref 19–32)
Chloride: 92 mEq/L — ABNORMAL LOW (ref 96–112)
Chloride: 98 mEq/L (ref 96–112)
Creatinine, Ser: 1.14 mg/dL (ref 0.4–1.5)
GFR calc Af Amer: >60 mL/min (ref >60)
GFR calc non Af Amer: 58 mL/min — ABNORMAL LOW (ref >60)
GFR calc non Af Amer: >60 mL/min (ref >60)
Glucose, Bld: 135 mg/dL — ABNORMAL HIGH (ref 70–99)
Glucose, Bld: 155 mg/dL — ABNORMAL HIGH (ref 70–99)
Glucose, Bld: 171 mg/dL — ABNORMAL HIGH (ref 70–99)
Potassium: 4.2 mEq/L (ref 3.5–5.1)
Potassium: 4.6 mEq/L (ref 3.5–5.1)
Sodium: 136 mEq/L (ref 135–145)

## 2011-01-03 LAB — GLUCOSE, CAPILLARY
Glucose-Capillary: 126 mg/dL — ABNORMAL HIGH (ref 70–99)
Glucose-Capillary: 130 mg/dL — ABNORMAL HIGH (ref 70–99)
Glucose-Capillary: 138 mg/dL — ABNORMAL HIGH (ref 70–99)
Glucose-Capillary: 148 mg/dL — ABNORMAL HIGH (ref 70–99)
Glucose-Capillary: 180 mg/dL — ABNORMAL HIGH (ref 70–99)

## 2011-01-03 LAB — PROTIME-INR
INR: 1.05 (ref 0.00–1.49)
INR: 1.39 (ref 0.00–1.49)
Prothrombin Time: 13.6 seconds (ref 11.6–15.2)
Prothrombin Time: 21 seconds — ABNORMAL HIGH (ref 11.6–15.2)

## 2011-01-03 LAB — HEMOGLOBIN A1C
Hgb A1c MFr Bld: 7.5 % — ABNORMAL HIGH (ref <5.7)
Mean Plasma Glucose: 169 mg/dL — ABNORMAL HIGH (ref <117)

## 2011-01-04 LAB — URINALYSIS, ROUTINE W REFLEX MICROSCOPIC
Ketones, ur: NEGATIVE mg/dL
Nitrite: NEGATIVE
Urobilinogen, UA: 0.2 mg/dL (ref 0.0–1.0)
pH: 5.5 (ref 5.0–8.0)

## 2011-01-04 LAB — DIFFERENTIAL
Basophils Absolute: 0 10*3/uL (ref 0.0–0.1)
Lymphocytes Relative: 19 % (ref 12–46)
Monocytes Absolute: 1 10*3/uL (ref 0.1–1.0)
Monocytes Relative: 9 % (ref 3–12)
Neutro Abs: 7.7 10*3/uL (ref 1.7–7.7)
Neutrophils Relative %: 67 % (ref 43–77)

## 2011-01-04 LAB — COMPREHENSIVE METABOLIC PANEL
Albumin: 4.2 g/dL (ref 3.5–5.2)
BUN: 16 mg/dL (ref 6–23)
Creatinine, Ser: 1.24 mg/dL (ref 0.4–1.5)
Total Bilirubin: 0.6 mg/dL (ref 0.3–1.2)
Total Protein: 6.9 g/dL (ref 6.0–8.3)

## 2011-01-04 LAB — CBC
MCH: 30.6 pg (ref 26.0–34.0)
MCV: 91.2 fL (ref 78.0–100.0)
Platelets: 227 10*3/uL (ref 150–400)
RDW: 16.5 % — ABNORMAL HIGH (ref 11.5–15.5)

## 2011-01-04 LAB — URINE CULTURE: Colony Count: NO GROWTH

## 2011-01-04 LAB — APTT: aPTT: 34 seconds (ref 24–37)

## 2011-01-04 LAB — PROTIME-INR: INR: 1.71 — ABNORMAL HIGH (ref 0.00–1.49)

## 2011-01-04 LAB — SURGICAL PCR SCREEN: MRSA, PCR: NEGATIVE

## 2011-01-05 LAB — URINALYSIS, ROUTINE W REFLEX MICROSCOPIC
Bilirubin Urine: NEGATIVE
Glucose, UA: NEGATIVE mg/dL
Ketones, ur: NEGATIVE mg/dL
pH: 5.5 (ref 5.0–8.0)

## 2011-01-05 LAB — GLUCOSE, CAPILLARY
Glucose-Capillary: 122 mg/dL — ABNORMAL HIGH (ref 70–99)
Glucose-Capillary: 170 mg/dL — ABNORMAL HIGH (ref 70–99)
Glucose-Capillary: 179 mg/dL — ABNORMAL HIGH (ref 70–99)

## 2011-01-05 LAB — DIFFERENTIAL
Basophils Absolute: 0 10*3/uL (ref 0.0–0.1)
Basophils Absolute: 0.1 10*3/uL (ref 0.0–0.1)
Basophils Relative: 0 % (ref 0–1)
Basophils Relative: 1 % (ref 0–1)
Eosinophils Absolute: 0 10*3/uL (ref 0.0–0.7)
Eosinophils Absolute: 0.6 10*3/uL (ref 0.0–0.7)
Eosinophils Relative: 0 % (ref 0–5)
Eosinophils Relative: 6 % — ABNORMAL HIGH (ref 0–5)
Lymphocytes Relative: 15 % (ref 12–46)
Lymphs Abs: 3.8 10*3/uL (ref 0.7–4.0)
Monocytes Absolute: 0.8 10*3/uL (ref 0.1–1.0)
Monocytes Absolute: 1.5 10*3/uL — ABNORMAL HIGH (ref 0.1–1.0)
Monocytes Relative: 6 % (ref 3–12)
Neutro Abs: 20 10*3/uL — ABNORMAL HIGH (ref 1.7–7.7)
Neutrophils Relative %: 79 % — ABNORMAL HIGH (ref 43–77)

## 2011-01-05 LAB — COMPREHENSIVE METABOLIC PANEL
ALT: 23 U/L (ref 0–53)
ALT: 32 U/L (ref 0–53)
AST: 37 U/L (ref 0–37)
AST: 41 U/L — ABNORMAL HIGH (ref 0–37)
Albumin: 3.8 g/dL (ref 3.5–5.2)
Albumin: 4.4 g/dL (ref 3.5–5.2)
Alkaline Phosphatase: 61 U/L (ref 39–117)
Alkaline Phosphatase: 85 U/L (ref 39–117)
BUN: 20 mg/dL (ref 6–23)
CO2: 24 mEq/L (ref 19–32)
Calcium: 8.6 mg/dL (ref 8.4–10.5)
Chloride: 96 mEq/L (ref 96–112)
Chloride: 99 mEq/L (ref 96–112)
Creatinine, Ser: 1.53 mg/dL — ABNORMAL HIGH (ref 0.4–1.5)
GFR calc Af Amer: 54 mL/min — ABNORMAL LOW (ref >60)
GFR calc Af Amer: >60 mL/min (ref >60)
GFR calc non Af Amer: 45 mL/min — ABNORMAL LOW (ref >60)
Glucose, Bld: 210 mg/dL — ABNORMAL HIGH (ref 70–99)
Potassium: 3 mEq/L — ABNORMAL LOW (ref 3.5–5.1)
Potassium: 4.5 mEq/L (ref 3.5–5.1)
Sodium: 135 mEq/L (ref 135–145)
Total Bilirubin: 0.6 mg/dL (ref 0.3–1.2)
Total Bilirubin: 1.4 mg/dL — ABNORMAL HIGH (ref 0.3–1.2)
Total Protein: 6.5 g/dL (ref 6.0–8.3)

## 2011-01-05 LAB — URINE CULTURE
Colony Count: NO GROWTH
Culture: NO GROWTH

## 2011-01-05 LAB — MAGNESIUM: Magnesium: 1.6 mg/dL (ref 1.5–2.5)

## 2011-01-05 LAB — CBC
HCT: 43.1 % (ref 39.0–52.0)
Hemoglobin: 14.4 g/dL (ref 13.0–17.0)
Platelets: 214 10*3/uL (ref 150–400)
RBC: 4.78 MIL/uL (ref 4.22–5.81)
RDW: 12.8 % (ref 11.5–15.5)
WBC: 25.3 10*3/uL — ABNORMAL HIGH (ref 4.0–10.5)
WBC: 9.2 10*3/uL (ref 4.0–10.5)

## 2011-01-05 LAB — TSH: TSH: 2.609 u[IU]/mL (ref 0.350–4.500)

## 2011-01-05 LAB — POCT I-STAT 3, ART BLOOD GAS (G3+)
Patient temperature: 98.6
pH, Arterial: 7.347 — ABNORMAL LOW (ref 7.350–7.450)

## 2011-01-05 LAB — PHOSPHORUS: Phosphorus: 3.6 mg/dL (ref 2.3–4.6)

## 2011-01-05 LAB — TYPE AND SCREEN
ABO/RH(D): O POS
Antibody Screen: NEGATIVE

## 2011-01-05 LAB — MRSA PCR SCREENING: MRSA by PCR: NEGATIVE

## 2011-01-05 LAB — CARDIAC PANEL(CRET KIN+CKTOT+MB+TROPI): Relative Index: 1.3 (ref 0.0–2.5)

## 2011-01-05 LAB — LACTIC ACID, PLASMA: Lactic Acid, Venous: 2.6 mmol/L — ABNORMAL HIGH (ref 0.5–2.2)

## 2011-01-08 LAB — CULTURE, BLOOD (ROUTINE X 2): Culture: NO GROWTH

## 2011-01-08 LAB — BASIC METABOLIC PANEL
BUN: 13 mg/dL (ref 6–23)
BUN: 13 mg/dL (ref 6–23)
BUN: 14 mg/dL (ref 6–23)
BUN: 18 mg/dL (ref 6–23)
BUN: 18 mg/dL (ref 6–23)
BUN: 20 mg/dL (ref 6–23)
BUN: 20 mg/dL (ref 6–23)
CO2: 28 mEq/L (ref 19–32)
CO2: 29 mEq/L (ref 19–32)
CO2: 29 mEq/L (ref 19–32)
Calcium: 7.9 mg/dL — ABNORMAL LOW (ref 8.4–10.5)
Calcium: 8.1 mg/dL — ABNORMAL LOW (ref 8.4–10.5)
Calcium: 8.2 mg/dL — ABNORMAL LOW (ref 8.4–10.5)
Calcium: 8.2 mg/dL — ABNORMAL LOW (ref 8.4–10.5)
Calcium: 8.2 mg/dL — ABNORMAL LOW (ref 8.4–10.5)
Calcium: 8.2 mg/dL — ABNORMAL LOW (ref 8.4–10.5)
Calcium: 8.3 mg/dL — ABNORMAL LOW (ref 8.4–10.5)
Calcium: 8.4 mg/dL (ref 8.4–10.5)
Chloride: 101 mEq/L (ref 96–112)
Chloride: 96 mEq/L (ref 96–112)
Chloride: 99 mEq/L (ref 96–112)
Creatinine, Ser: 1.02 mg/dL (ref 0.4–1.5)
Creatinine, Ser: 1.07 mg/dL (ref 0.4–1.5)
Creatinine, Ser: 1.08 mg/dL (ref 0.4–1.5)
Creatinine, Ser: 1.16 mg/dL (ref 0.4–1.5)
Creatinine, Ser: 1.28 mg/dL (ref 0.4–1.5)
GFR calc Af Amer: >60 mL/min (ref >60)
GFR calc Af Amer: >60 mL/min (ref >60)
GFR calc Af Amer: >60 mL/min (ref >60)
GFR calc Af Amer: >60 mL/min (ref >60)
GFR calc Af Amer: >60 mL/min (ref >60)
GFR calc non Af Amer: 52 mL/min — ABNORMAL LOW (ref >60)
GFR calc non Af Amer: 55 mL/min — ABNORMAL LOW (ref >60)
GFR calc non Af Amer: 56 mL/min — ABNORMAL LOW (ref >60)
GFR calc non Af Amer: >60 mL/min (ref >60)
GFR calc non Af Amer: >60 mL/min (ref >60)
GFR calc non Af Amer: >60 mL/min (ref >60)
GFR calc non Af Amer: >60 mL/min (ref >60)
Glucose, Bld: 111 mg/dL — ABNORMAL HIGH (ref 70–99)
Glucose, Bld: 112 mg/dL — ABNORMAL HIGH (ref 70–99)
Glucose, Bld: 130 mg/dL — ABNORMAL HIGH (ref 70–99)
Glucose, Bld: 141 mg/dL — ABNORMAL HIGH (ref 70–99)
Potassium: 3.9 mEq/L (ref 3.5–5.1)
Potassium: 4.1 mEq/L (ref 3.5–5.1)
Potassium: 4.1 mEq/L (ref 3.5–5.1)
Potassium: 4.2 mEq/L (ref 3.5–5.1)
Sodium: 128 mEq/L — ABNORMAL LOW (ref 135–145)
Sodium: 131 mEq/L — ABNORMAL LOW (ref 135–145)
Sodium: 132 mEq/L — ABNORMAL LOW (ref 135–145)
Sodium: 134 mEq/L — ABNORMAL LOW (ref 135–145)

## 2011-01-08 LAB — CBC
HCT: 27.8 % — ABNORMAL LOW (ref 39.0–52.0)
HCT: 29.9 % — ABNORMAL LOW (ref 39.0–52.0)
HCT: 32.2 % — ABNORMAL LOW (ref 39.0–52.0)
HCT: 33.9 % — ABNORMAL LOW (ref 39.0–52.0)
HCT: 38.3 % — ABNORMAL LOW (ref 39.0–52.0)
Hemoglobin: 10.2 g/dL — ABNORMAL LOW (ref 13.0–17.0)
Hemoglobin: 11 g/dL — ABNORMAL LOW (ref 13.0–17.0)
Hemoglobin: 11.2 g/dL — ABNORMAL LOW (ref 13.0–17.0)
Hemoglobin: 12.9 g/dL — ABNORMAL LOW (ref 13.0–17.0)
MCHC: 33.5 g/dL (ref 30.0–36.0)
MCHC: 34.2 g/dL (ref 30.0–36.0)
MCHC: 34.4 g/dL (ref 30.0–36.0)
MCHC: 34.5 g/dL (ref 30.0–36.0)
MCHC: 35.2 g/dL (ref 30.0–36.0)
MCV: 95.3 fL (ref 78.0–100.0)
MCV: 97 fL (ref 78.0–100.0)
MCV: 97.7 fL (ref 78.0–100.0)
Platelets: 155 10*3/uL (ref 150–400)
Platelets: 189 10*3/uL (ref 150–400)
Platelets: 270 10*3/uL (ref 150–400)
Platelets: 330 10*3/uL (ref 150–400)
Platelets: 362 10*3/uL (ref 150–400)
RBC: 2.97 MIL/uL — ABNORMAL LOW (ref 4.22–5.81)
RBC: 3.32 MIL/uL — ABNORMAL LOW (ref 4.22–5.81)
RBC: 3.34 MIL/uL — ABNORMAL LOW (ref 4.22–5.81)
RBC: 3.41 MIL/uL — ABNORMAL LOW (ref 4.22–5.81)
RBC: 3.47 MIL/uL — ABNORMAL LOW (ref 4.22–5.81)
RBC: 3.54 MIL/uL — ABNORMAL LOW (ref 4.22–5.81)
RBC: 3.95 MIL/uL — ABNORMAL LOW (ref 4.22–5.81)
RDW: 12.6 % (ref 11.5–15.5)
RDW: 12.6 % (ref 11.5–15.5)
RDW: 12.8 % (ref 11.5–15.5)
RDW: 12.9 % (ref 11.5–15.5)
RDW: 13 % (ref 11.5–15.5)
WBC: 10.9 10*3/uL — ABNORMAL HIGH (ref 4.0–10.5)
WBC: 11.5 10*3/uL — ABNORMAL HIGH (ref 4.0–10.5)
WBC: 13.9 10*3/uL — ABNORMAL HIGH (ref 4.0–10.5)
WBC: 8.7 10*3/uL (ref 4.0–10.5)

## 2011-01-08 LAB — GLUCOSE, CAPILLARY
Glucose-Capillary: 100 mg/dL — ABNORMAL HIGH (ref 70–99)
Glucose-Capillary: 100 mg/dL — ABNORMAL HIGH (ref 70–99)
Glucose-Capillary: 102 mg/dL — ABNORMAL HIGH (ref 70–99)
Glucose-Capillary: 104 mg/dL — ABNORMAL HIGH (ref 70–99)
Glucose-Capillary: 105 mg/dL — ABNORMAL HIGH (ref 70–99)
Glucose-Capillary: 114 mg/dL — ABNORMAL HIGH (ref 70–99)
Glucose-Capillary: 115 mg/dL — ABNORMAL HIGH (ref 70–99)
Glucose-Capillary: 115 mg/dL — ABNORMAL HIGH (ref 70–99)
Glucose-Capillary: 116 mg/dL — ABNORMAL HIGH (ref 70–99)
Glucose-Capillary: 117 mg/dL — ABNORMAL HIGH (ref 70–99)
Glucose-Capillary: 119 mg/dL — ABNORMAL HIGH (ref 70–99)
Glucose-Capillary: 119 mg/dL — ABNORMAL HIGH (ref 70–99)
Glucose-Capillary: 119 mg/dL — ABNORMAL HIGH (ref 70–99)
Glucose-Capillary: 124 mg/dL — ABNORMAL HIGH (ref 70–99)
Glucose-Capillary: 125 mg/dL — ABNORMAL HIGH (ref 70–99)
Glucose-Capillary: 126 mg/dL — ABNORMAL HIGH (ref 70–99)
Glucose-Capillary: 127 mg/dL — ABNORMAL HIGH (ref 70–99)
Glucose-Capillary: 128 mg/dL — ABNORMAL HIGH (ref 70–99)
Glucose-Capillary: 129 mg/dL — ABNORMAL HIGH (ref 70–99)
Glucose-Capillary: 131 mg/dL — ABNORMAL HIGH (ref 70–99)
Glucose-Capillary: 134 mg/dL — ABNORMAL HIGH (ref 70–99)
Glucose-Capillary: 136 mg/dL — ABNORMAL HIGH (ref 70–99)
Glucose-Capillary: 140 mg/dL — ABNORMAL HIGH (ref 70–99)
Glucose-Capillary: 154 mg/dL — ABNORMAL HIGH (ref 70–99)
Glucose-Capillary: 89 mg/dL (ref 70–99)
Glucose-Capillary: 96 mg/dL (ref 70–99)
Glucose-Capillary: 98 mg/dL (ref 70–99)

## 2011-01-08 LAB — CARDIAC PANEL(CRET KIN+CKTOT+MB+TROPI)
Relative Index: 1.4 (ref 0.0–2.5)
Total CK: 249 U/L — ABNORMAL HIGH (ref 7–232)
Troponin I: 0.43 ng/mL — ABNORMAL HIGH (ref 0.00–0.06)
Troponin I: 0.77 ng/mL (ref 0.00–0.06)

## 2011-01-08 LAB — LIPID PANEL
Cholesterol: 118 mg/dL (ref 0–200)
HDL: 42 mg/dL (ref >39)
Total CHOL/HDL Ratio: 2.8 RATIO
VLDL: 15 mg/dL (ref 0–40)

## 2011-01-08 LAB — PROTIME-INR
INR: 1.14 (ref 0.00–1.49)
INR: 1.2 (ref 0.00–1.49)
INR: 1.27 (ref 0.00–1.49)
INR: 1.36 (ref 0.00–1.49)
INR: 2.7 — ABNORMAL HIGH (ref 0.00–1.49)
INR: 2.9 — ABNORMAL HIGH (ref 0.00–1.49)
INR: 3.31 — ABNORMAL HIGH (ref 0.00–1.49)
INR: 4.04 — ABNORMAL HIGH (ref 0.00–1.49)
Prothrombin Time: 14.4 seconds (ref 11.6–15.2)
Prothrombin Time: 15.1 seconds (ref 11.6–15.2)
Prothrombin Time: 33.4 seconds — ABNORMAL HIGH (ref 11.6–15.2)
Prothrombin Time: 35.3 seconds — ABNORMAL HIGH (ref 11.6–15.2)

## 2011-01-08 LAB — URINALYSIS, ROUTINE W REFLEX MICROSCOPIC
Ketones, ur: 15 mg/dL — AB
Nitrite: NEGATIVE
Protein, ur: 100 mg/dL — AB
Urobilinogen, UA: 1 mg/dL (ref 0.0–1.0)

## 2011-01-08 LAB — HEMOGLOBIN A1C: Hgb A1c MFr Bld: 6.3 % — ABNORMAL HIGH (ref 4.6–6.1)

## 2011-01-22 ENCOUNTER — Encounter: Payer: Self-pay | Admitting: *Deleted

## 2011-01-27 ENCOUNTER — Other Ambulatory Visit: Payer: Self-pay | Admitting: Nurse Practitioner

## 2011-01-27 ENCOUNTER — Encounter: Payer: Self-pay | Admitting: *Deleted

## 2011-01-27 MED ORDER — AMIODARONE HCL 200 MG PO TABS: 200.0000 mg | ORAL_TABLET | Freq: Every day | ORAL | Status: DC

## 2011-02-01 IMAGING — CR DG CHEST 2V
2 series · 2 of 2 positions shown · non-contrast
Comparison: None

CLINICAL DATA: Preop for right knee surgery, history of atrial
fibrillation

CHEST - 2 VIEW

[view not recorded (1 of 2)]
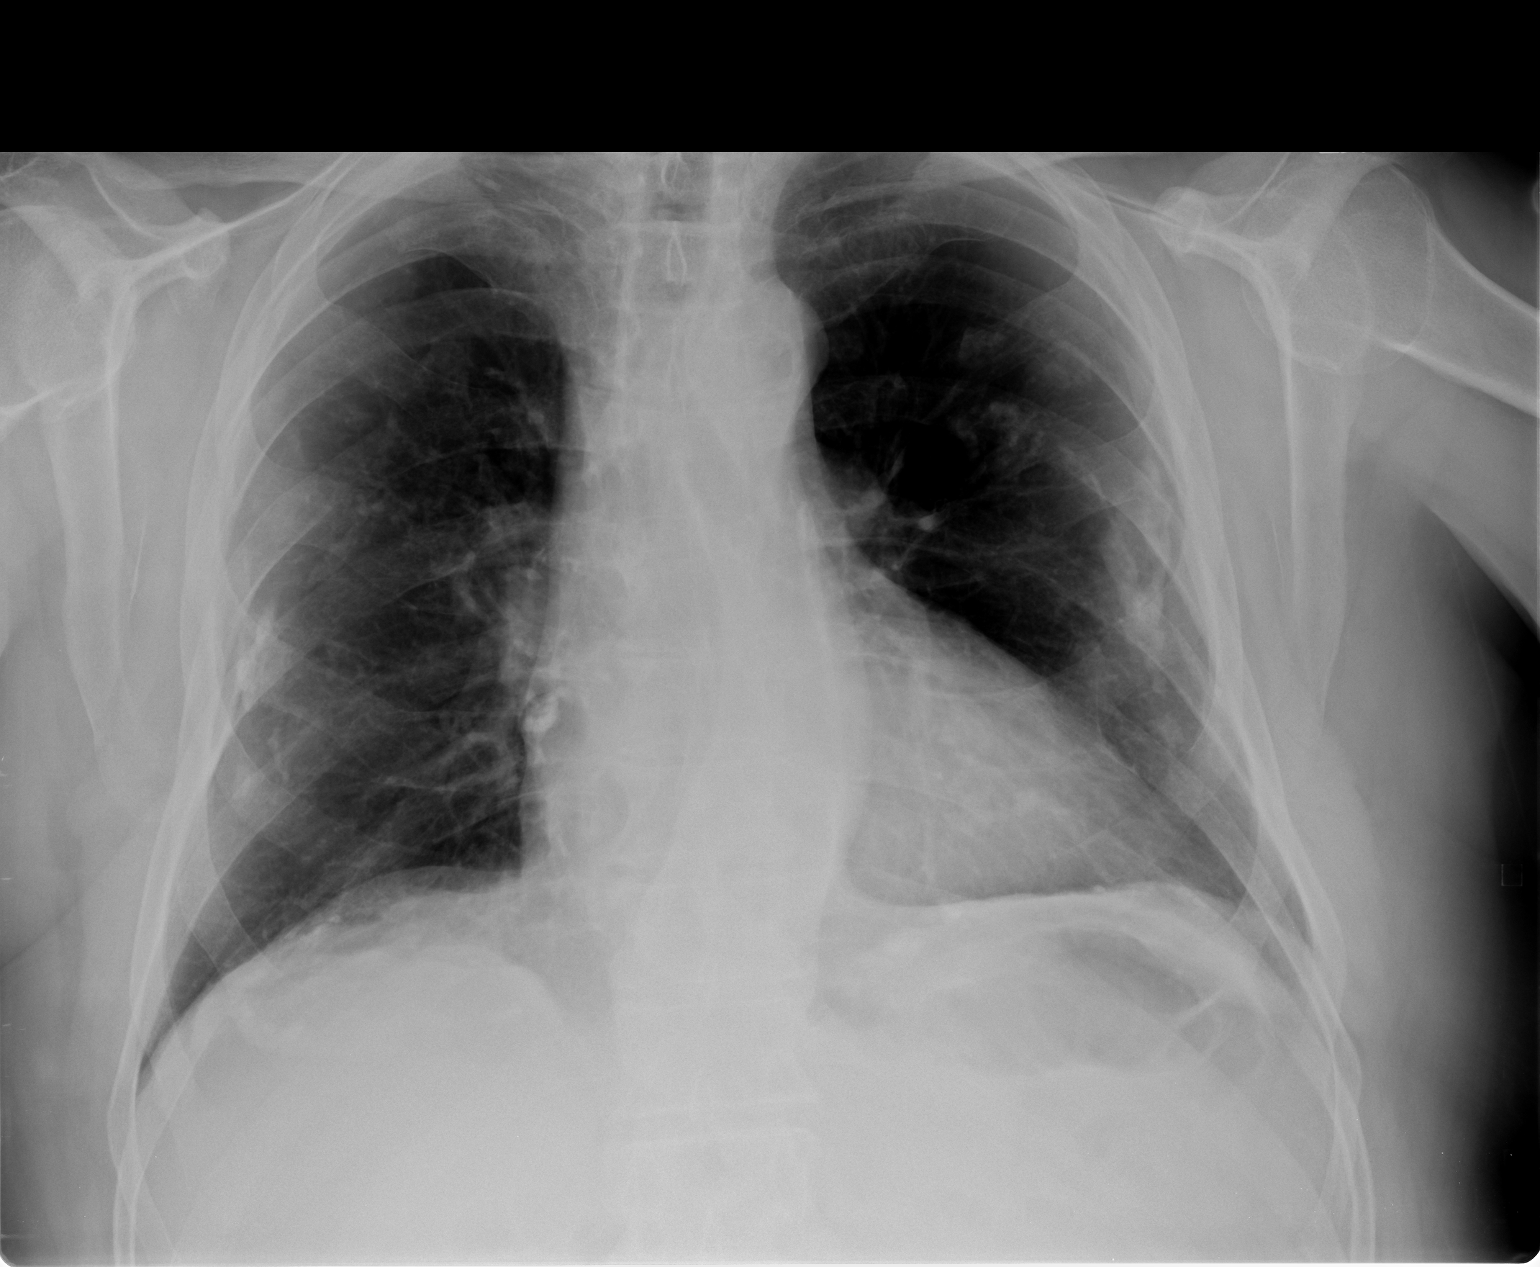

[view not recorded (2 of 2)]
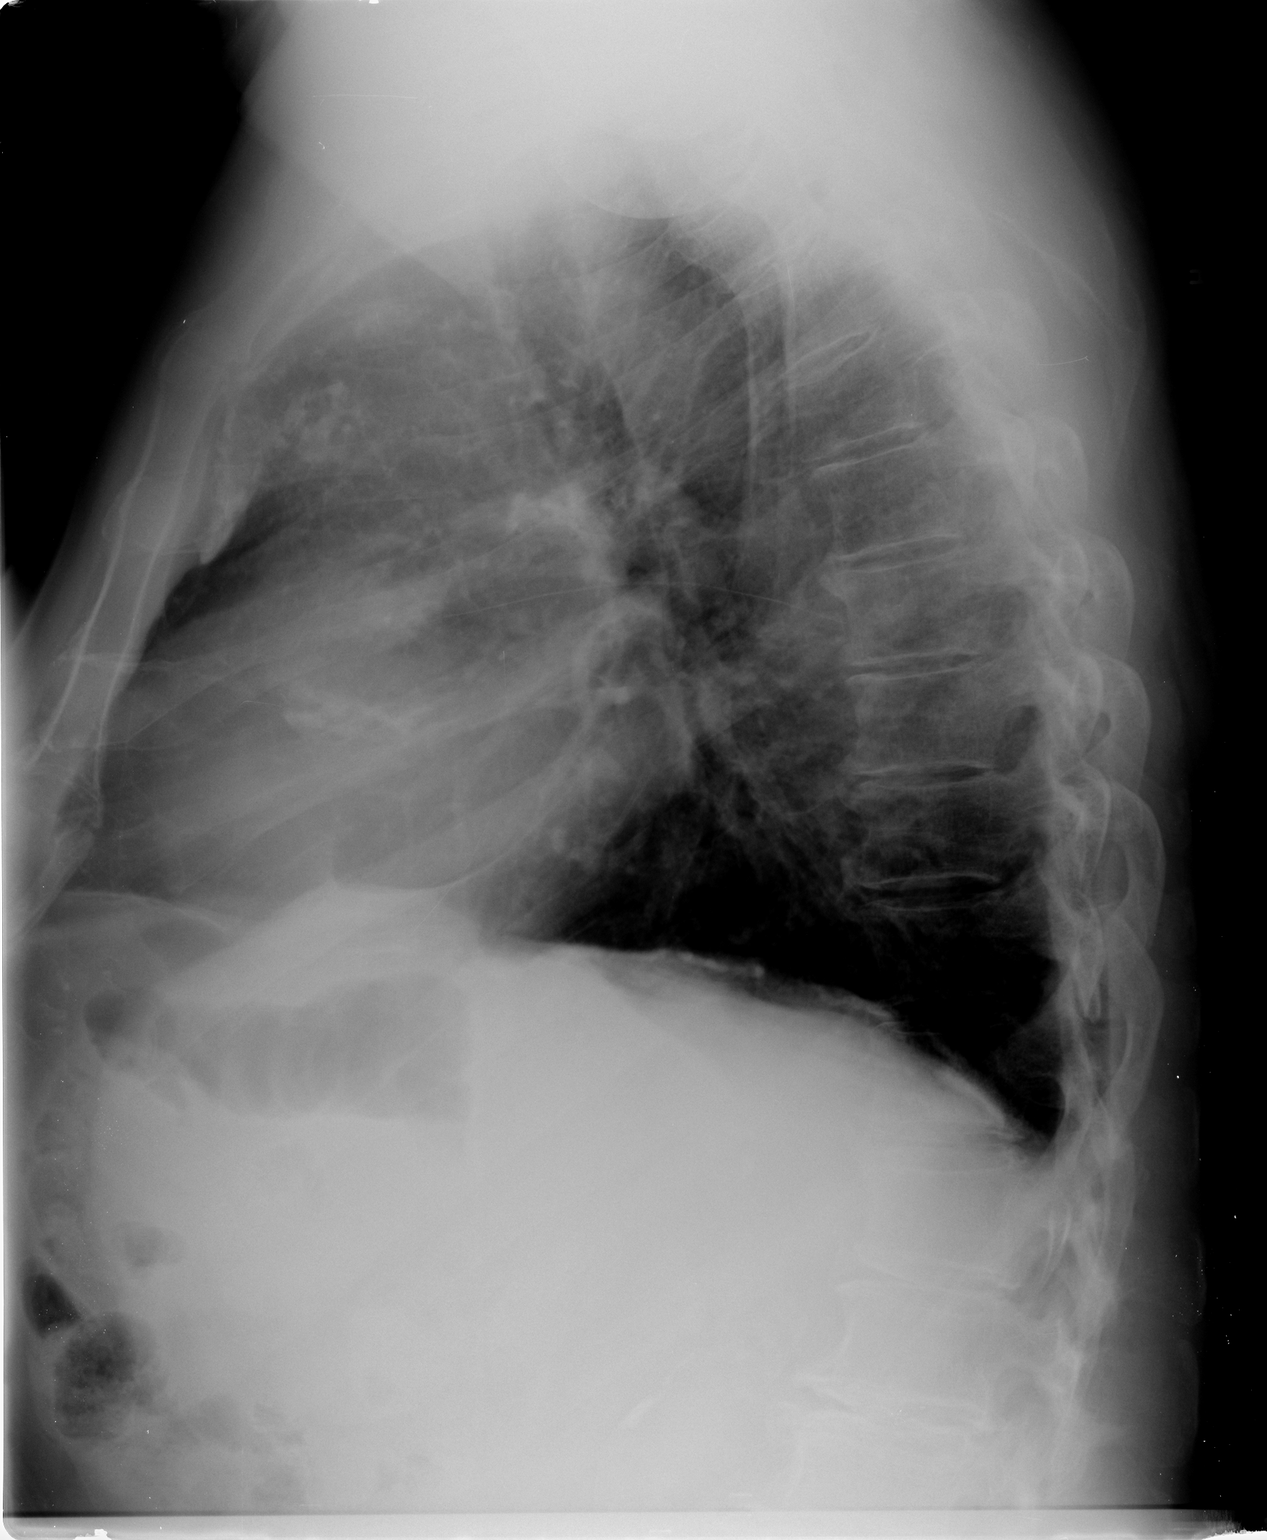

[2 of 2 positions shown; findings below may reference images not displayed]

FINDINGS: Opacities peripherally bilaterally probably represent
calcified pleural plaque formation.  There does appear to be some
calcification of the hemidiaphragms, suggesting changes of
asbestosis.  No active infiltrate or effusion is seen.  The heart
is mildly enlarged.  There are degenerative changes in the thoracic
spine.
IMPRESSION: 1.  No active lung disease.
2.  Changes of asbestosis.
3.  Mild cardiomegaly.

## 2011-02-07 IMAGING — CR DG CHEST 1V PORT
1 series · 1 of 1 positions shown · non-contrast
Comparison: 11/12/2009

CLINICAL DATA: Status post CPR.

PORTABLE CHEST - 1 VIEW

[view not recorded]
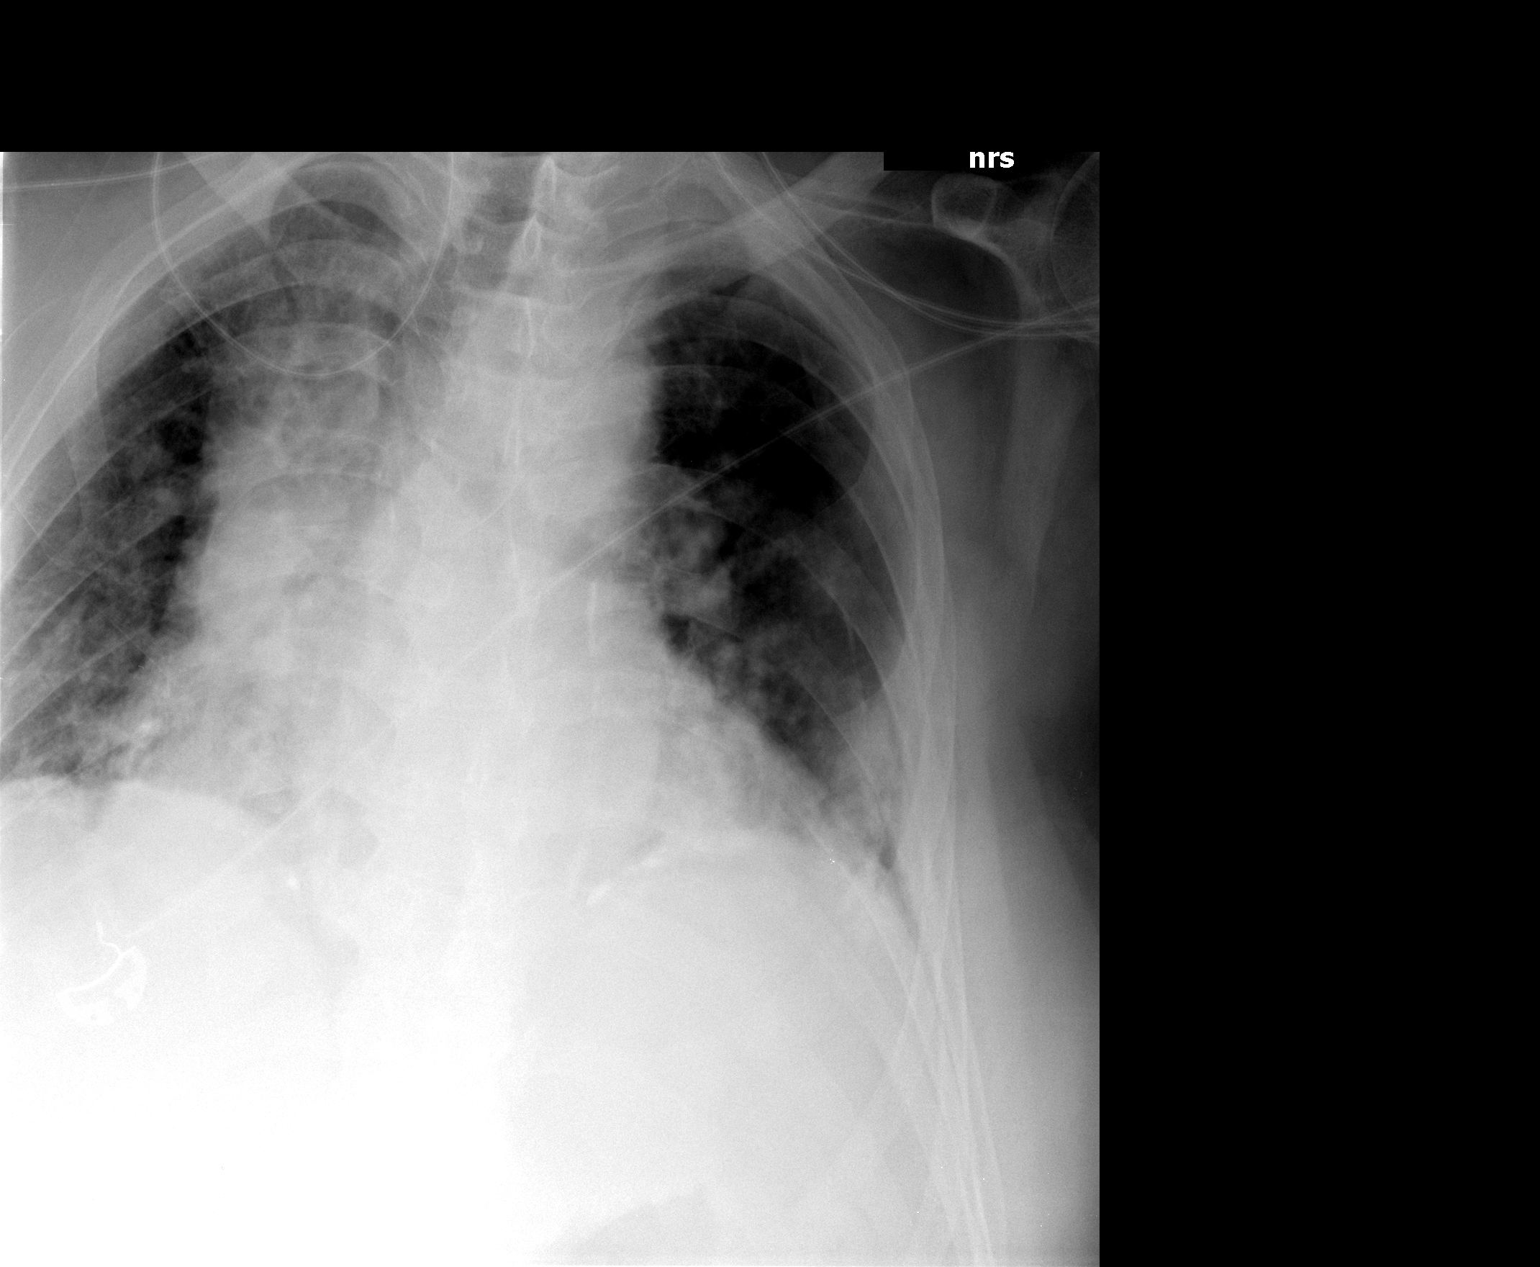

[1 of 1 positions shown; findings below may reference images not displayed]

FINDINGS: Present examination does not include the lateral aspect
of the right lower lung.  Pleural thickening.  This limits
detection of subtle pneumothorax.  No gross pneumothorax detected.
Cardiomegaly.  Pulmonary vascular congestion.  Widening of the
mediastinum.  This may be related to AP medication.  Calcified
aorta.
IMPRESSION: Limited exam as described above.

This has been made a call report.

## 2011-02-07 IMAGING — CR DG CHEST 1V PORT
1 series · 1 of 1 positions shown · non-contrast
Comparison: 5764 hours same day and earlier.

CLINICAL DATA: 76-year-old male with central line placement.

PORTABLE CHEST - 1 VIEW

[view not recorded]
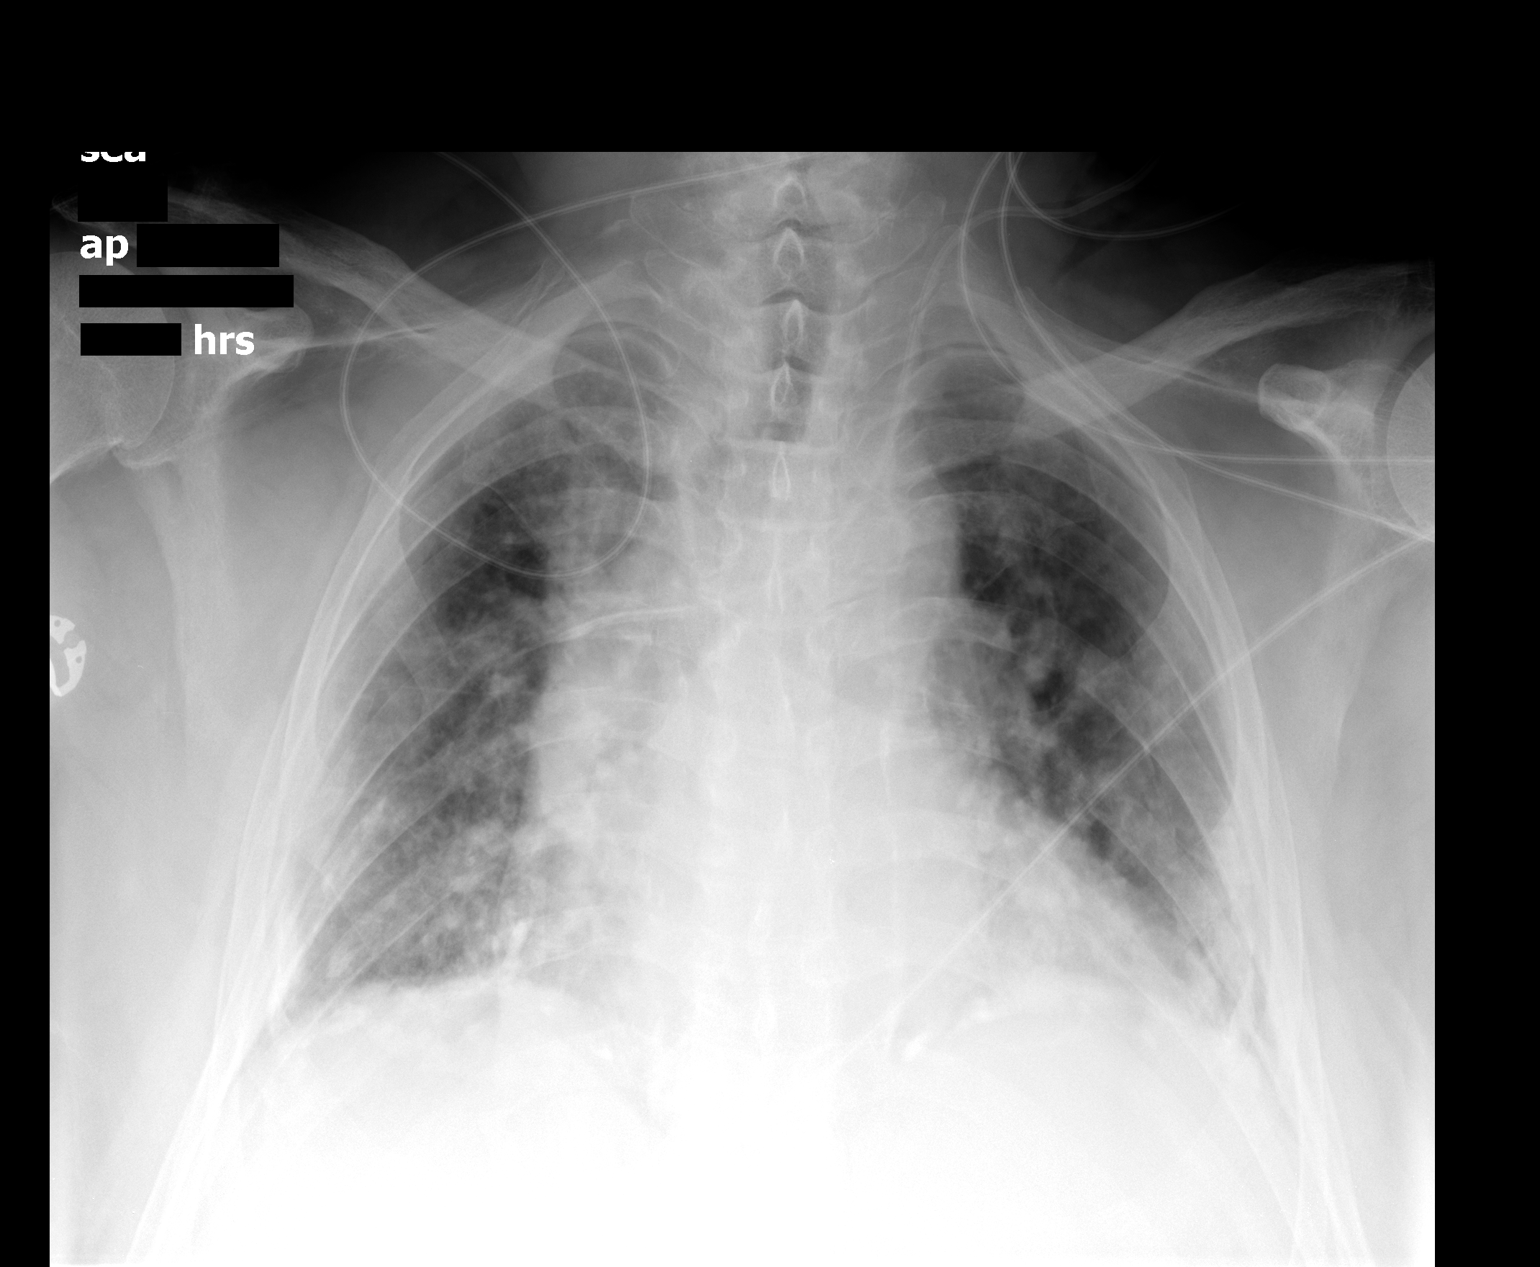

[1 of 1 positions shown; findings below may reference images not displayed]

FINDINGS: Portable semi upright AP view 6206 hours.  New left IJ
approach central venous catheter projects at the lateral right
mediastinal contour at the level of the carina.  No pneumothorax
identified.  Increased interstitial opacity and indistinct
vasculature not significantly changed.  Stable cardiac size and
mediastinal contours.  Continued low lung volumes.
IMPRESSION: 1.  Left IJ catheter placed, tip at the level of the SVC.
Recommend correlation with appropriate blood return.
2.  No pneumothorax.  Ongoing pulmonary edema or atypical
infection.

## 2011-02-09 IMAGING — CR DG CHEST 1V PORT
1 series · 1 of 1 positions shown · non-contrast
Comparison: 11/18/2009 and earlier

CLINICAL DATA: Post CPR 11/18/2009

PORTABLE CHEST - 1 VIEW

[AP]
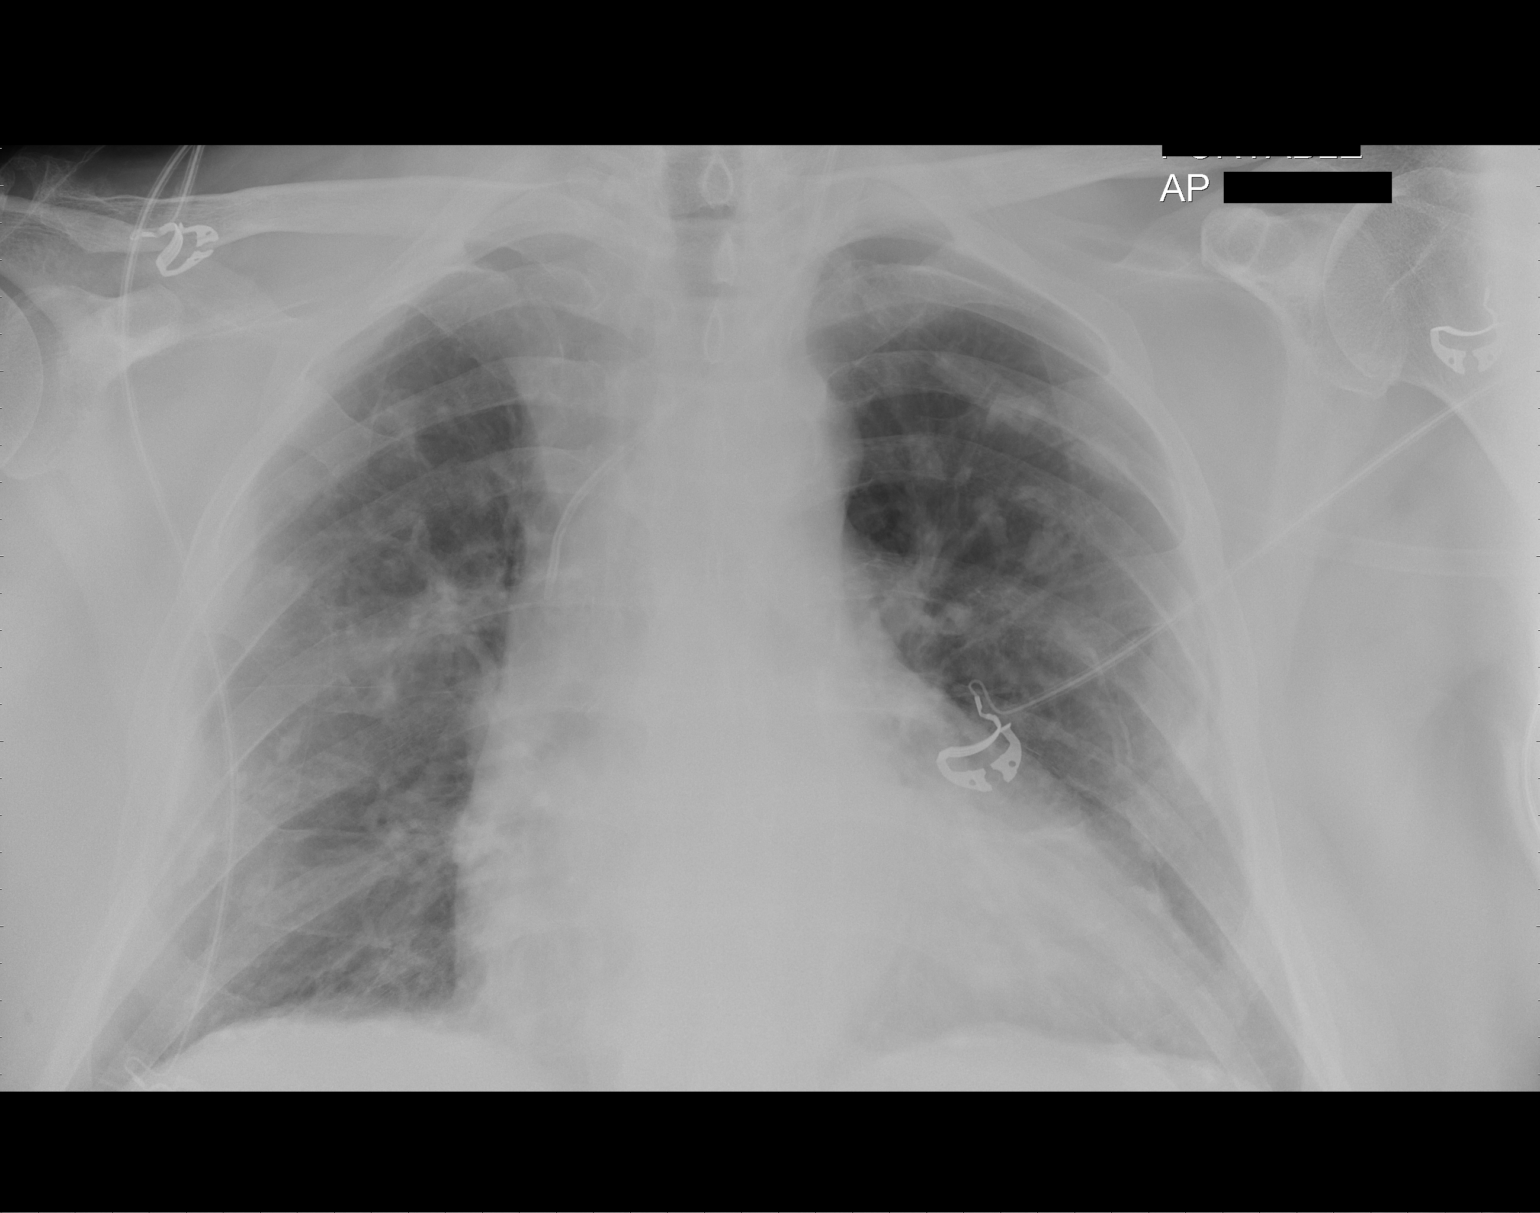

[1 of 1 positions shown; findings below may reference images not displayed]

FINDINGS: Heart mildly enlarged without frank congestive heart
failure.  Chronic parenchymal changes and pleural calcifications
again noted.  No acute findings.  Central line now more vertically
oriented in the proximal SVC
IMPRESSION: 1.  Mild cardiomegaly without congestive heart failure.
2.  Chronic pleural and parenchymal changes with no active disease.
3.  The left IJ central line tip is now vertically oriented in the
proximal SVC.

## 2011-02-12 ENCOUNTER — Ambulatory Visit (INDEPENDENT_AMBULATORY_CARE_PROVIDER_SITE_OTHER): Payer: Medicare Other | Admitting: *Deleted

## 2011-02-12 ENCOUNTER — Other Ambulatory Visit: Payer: Self-pay | Admitting: Internal Medicine

## 2011-02-12 DIAGNOSIS — Z95810 Presence of automatic (implantable) cardiac defibrillator: Secondary | ICD-10-CM

## 2011-02-12 DIAGNOSIS — I4891 Unspecified atrial fibrillation: Secondary | ICD-10-CM

## 2011-02-12 DIAGNOSIS — I472 Ventricular tachycardia: Secondary | ICD-10-CM

## 2011-02-22 NOTE — Progress Notes (Signed)
icd remote check  

## 2011-03-08 ENCOUNTER — Encounter: Payer: Self-pay | Admitting: *Deleted

## 2011-03-25 ENCOUNTER — Encounter: Payer: Self-pay | Admitting: Internal Medicine

## 2011-04-28 ENCOUNTER — Ambulatory Visit (INDEPENDENT_AMBULATORY_CARE_PROVIDER_SITE_OTHER): Payer: Medicare Other | Admitting: Internal Medicine

## 2011-04-28 ENCOUNTER — Encounter: Payer: Self-pay | Admitting: Internal Medicine

## 2011-04-28 DIAGNOSIS — I2510 Atherosclerotic heart disease of native coronary artery without angina pectoris: Secondary | ICD-10-CM

## 2011-04-28 DIAGNOSIS — I4891 Unspecified atrial fibrillation: Secondary | ICD-10-CM

## 2011-04-28 DIAGNOSIS — I472 Ventricular tachycardia: Secondary | ICD-10-CM

## 2011-04-28 DIAGNOSIS — Z95810 Presence of automatic (implantable) cardiac defibrillator: Secondary | ICD-10-CM

## 2011-04-28 LAB — ICD DEVICE OBSERVATION
AL AMPLITUDE: 3.625 mv
AL IMPEDENCE ICD: 589 Ohm
AL THRESHOLD: 1 V
ATRIAL PACING ICD: 0 pct
BATTERY VOLTAGE: 3.1432 V
CHARGE TIME: 8.298 s
DEV-0020ICD: NEGATIVE
RV LEAD IMPEDENCE ICD: 551 Ohm
TOT-0002: 0
TOT-0006: 20110209000000
TZAT-0001FASTVT: 1
TZAT-0001SLOWVT: 1
TZAT-0001SLOWVT: 2
TZAT-0004SLOWVT: 8
TZAT-0004SLOWVT: 8
TZAT-0013SLOWVT: 2
TZAT-0013SLOWVT: 2
TZAT-0018FASTVT: NEGATIVE
TZAT-0018SLOWVT: NEGATIVE
TZAT-0019FASTVT: 8 V
TZAT-0020SLOWVT: 1.5 ms
TZAT-0020SLOWVT: 1.5 ms
TZON-0003FASTVT: 250 ms
TZON-0003VSLOWVT: 450 ms
TZON-0004SLOWVT: 16
TZON-0004VSLOWVT: 20
TZST-0001FASTVT: 2
TZST-0001FASTVT: 4
TZST-0001FASTVT: 6
TZST-0001SLOWVT: 3
TZST-0001SLOWVT: 5
TZST-0003FASTVT: 35 J
TZST-0003FASTVT: 35 J
TZST-0003SLOWVT: 25 J
VENTRICULAR PACING ICD: 0 pct

## 2011-04-28 NOTE — Patient Instructions (Signed)
Your physician wants you to follow-up in: 12 months with Dr Taylor You will receive a reminder letter in the mail two months in advance. If you don't receive a letter, please call our office to schedule the follow-up appointment.  Remote monitoring is used to monitor your Pacemaker of ICD from home. This monitoring reduces the number of office visits required to check your device to one time per year. It allows us to keep an eye on the functioning of your device to ensure it is working properly. You are scheduled for a device check from home on 07/30/2011. You may send your transmission at any time that day. If you have a wireless device, the transmission will be sent automatically. After your physician reviews your transmission, you will receive a postcard with your next transmission date.   

## 2011-04-28 NOTE — Assessment & Plan Note (Signed)
He denies anginal symptoms. He will continue his current medical therapy. 

## 2011-04-28 NOTE — Assessment & Plan Note (Signed)
His ventricular rate appears to be well-controlled. He is maintaining sinus rhythm. Now that he is off of amiodarone, I suspect he will have more atrial fib. We'll see him back in several months to see how he's doing.

## 2011-04-28 NOTE — Progress Notes (Signed)
HPI Jeremy Cobb returns today for followup. He is a pleasant 77 year old man with a history of ventricular fibrillation arrest, status post ICD implantation, congestive heart failure, who has done well over the last few weeks. Prior to this the patient developed chest discomfort and stopped both his amiodarone and his Pradaxa. His symptoms resolved. Since then he has reflected and decided he does not want to take any more anti-coagulation therapy. He understands that he may go out of rhythm off of his antiarrhythmic drugs. He has had no chest pain or shortness of breath. He denies syncope. No Known Allergies   Current Outpatient Prescriptions  Medication Sig Dispense Refill  . aspirin (ASPIR-81) 81 MG EC tablet Take 81 mg by mouth daily.        . digoxin (LANOXIN) 0.125 MG tablet Take 125 mcg by mouth daily.        Marland Kitchen docusate sodium (COLACE) 100 MG capsule Take 100 mg by mouth 2 (two) times daily.        . furosemide (LASIX) 20 MG tablet Take 20 mg by mouth daily.        Marland Kitchen glipiZIDE (GLUCOTROL) 2.5 MG 24 hr tablet Take 2.5 mg by mouth 2 (two) times daily.        Marland Kitchen levothyroxine (LEVOTHROID) 50 MCG tablet Take 50 mcg by mouth daily.        Marland Kitchen omeprazole (PRILOSEC) 40 MG capsule Take 40 mg by mouth 2 (two) times daily.        Marland Kitchen oxyCODONE-acetaminophen (PERCOCET) 5-325 MG per tablet Take 1 tablet by mouth as directed.        . potassium chloride (KLOR-CON) 20 MEQ packet Take 20 mEq by mouth daily. Take 1/2 tab       . rosuvastatin (CRESTOR) 40 MG tablet Take 40 mg by mouth daily.        Marland Kitchen DISCONTD: amiodarone (PACERONE) 200 MG tablet Take 1 tablet (200 mg total) by mouth daily.  30 tablet  11  . DISCONTD: dabigatran (PRADAXA) 150 MG CAPS Take 150 mg by mouth 2 (two) times daily.           Past Medical History  Diagnosis Date  . CAD (coronary artery disease)     nonobstructive  . Atrial fibrillation   . 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 Diseases of tricuspid valve   .  Mitral valve insufficiency and aortic valve insufficiency   . Other and unspecified hyperlipidemia   . Esophageal reflux   . Type II or unspecified type diabetes mellitus without mention of complication, not stated as uncontrolled   . Unspecified disorder of kidney and ureter   . Unspecified hypothyroidism   . Obstructive sleep apnea (adult) (pediatric)   . Osteoarthrosis, unspecified whether generalized or localized, unspecified site   . Asbestosis   . Degeneration of intervertebral disc, site unspecified     ROS:   All systems reviewed and negative except as noted in the HPI.   Past Surgical History  Procedure Date  . Right total knee arthroplasty Nov 18, 2009     No family history on file.   History   Social History  . Marital Status: Married    Spouse Name: N/A    Number of Children: N/A  . Years of Education: N/A   Occupational History  . Not on file.   Social History Main Topics  . Smoking status: Never Smoker   . Smokeless tobacco: Not on file  .  Alcohol Use: Not on file  . Drug Use: Not on file  . Sexually Active: Not on file   Other Topics Concern  . Not on file   Social History Narrative   No ETOH or tobacco abuse.     BP 154/81  Pulse 60  Ht 5\' 11"  (1.803 m)  Wt 237 lb 1.9 oz (107.557 kg)  BMI 33.07 kg/m2  Physical Exam:  Well appearing NAD HEENT: Unremarkable Neck:  No JVD, no thyromegally Lymphatics:  No adenopathy Back:  No CVA tenderness Lungs:  Clear with no wheezes, rales, or rhonchi. Well-healed ICD incision. HEART:  Regular rate rhythm, no murmurs, no rubs, no clicks Abd:  Flat, positive bowel sounds, no organomegally, no rebound, no guarding Ext:  2 plus pulses, no edema, no cyanosis, no clubbing Skin:  No rashes no nodules Neuro:  CN II through XII intact, motor grossly intact  DEVICE  Normal device function.  See PaceArt for details.   Assess/Plan:

## 2011-04-28 NOTE — Assessment & Plan Note (Signed)
His device is working normally. He will follow for repeat ICD test in several months.

## 2011-05-22 ENCOUNTER — Other Ambulatory Visit: Payer: Self-pay | Admitting: Internal Medicine

## 2011-05-22 ENCOUNTER — Telehealth: Payer: Self-pay | Admitting: Internal Medicine

## 2011-05-22 NOTE — Telephone Encounter (Signed)
Patient said he is having a colonoscopy on Friday 05/29/11 . Pt has a defibrillator, he would like to know if it is okay with Dr. Ladona Ridgel for him to have procedure done.

## 2011-05-22 NOTE — Telephone Encounter (Signed)
Pt having a colonoscopy on Monday and need to know what to do about his device he was given a magnet before and he's not sure what to do

## 2011-05-26 NOTE — Telephone Encounter (Signed)
Lmom for pt ok to proceed with colonoscopy

## 2011-07-30 ENCOUNTER — Other Ambulatory Visit: Payer: Self-pay | Admitting: Internal Medicine

## 2011-07-30 ENCOUNTER — Ambulatory Visit (INDEPENDENT_AMBULATORY_CARE_PROVIDER_SITE_OTHER): Payer: Medicare Other | Admitting: *Deleted

## 2011-07-30 ENCOUNTER — Encounter: Payer: Self-pay | Admitting: Internal Medicine

## 2011-07-30 DIAGNOSIS — Z95810 Presence of automatic (implantable) cardiac defibrillator: Secondary | ICD-10-CM

## 2011-07-30 DIAGNOSIS — I4891 Unspecified atrial fibrillation: Secondary | ICD-10-CM

## 2011-07-30 DIAGNOSIS — I472 Ventricular tachycardia: Secondary | ICD-10-CM

## 2011-08-01 LAB — REMOTE ICD DEVICE
AL AMPLITUDE: 3.5 mv
AL IMPEDENCE ICD: 551 Ohm
ATRIAL PACING ICD: 0 pct
RV LEAD IMPEDENCE ICD: 532 Ohm
TOT-0001: 1
TOT-0006: 20110209000000
TZAT-0001FASTVT: 1
TZAT-0001SLOWVT: 1
TZAT-0001SLOWVT: 2
TZAT-0004SLOWVT: 8
TZAT-0004SLOWVT: 8
TZAT-0005FASTVT: 88 pct
TZAT-0012SLOWVT: 200 ms
TZAT-0018FASTVT: NEGATIVE
TZAT-0019SLOWVT: 8 V
TZAT-0019SLOWVT: 8 V
TZAT-0020FASTVT: 1.5 ms
TZAT-0020SLOWVT: 1.5 ms
TZAT-0020SLOWVT: 1.5 ms
TZON-0003FASTVT: 250 ms
TZON-0003VSLOWVT: 450 ms
TZON-0004VSLOWVT: 20
TZST-0001FASTVT: 2
TZST-0001FASTVT: 4
TZST-0001FASTVT: 5
TZST-0001FASTVT: 6
TZST-0001SLOWVT: 3
TZST-0001SLOWVT: 5
TZST-0003FASTVT: 35 J
TZST-0003FASTVT: 35 J
TZST-0003SLOWVT: 15 J
TZST-0003SLOWVT: 25 J
TZST-0003SLOWVT: 35 J

## 2011-08-03 NOTE — Progress Notes (Signed)
icd remote check  

## 2011-08-18 ENCOUNTER — Encounter: Payer: Self-pay | Admitting: *Deleted

## 2011-10-29 ENCOUNTER — Ambulatory Visit (INDEPENDENT_AMBULATORY_CARE_PROVIDER_SITE_OTHER): Payer: Medicare Other | Admitting: *Deleted

## 2011-10-29 ENCOUNTER — Encounter: Payer: Self-pay | Admitting: Internal Medicine

## 2011-10-29 ENCOUNTER — Other Ambulatory Visit: Payer: Self-pay | Admitting: Internal Medicine

## 2011-10-29 DIAGNOSIS — I472 Ventricular tachycardia: Secondary | ICD-10-CM

## 2011-11-01 LAB — REMOTE ICD DEVICE
AL AMPLITUDE: 3.3 mv
AL IMPEDENCE ICD: 551 Ohm
ATRIAL PACING ICD: 0.01 pct
CHARGE TIME: 8.428 s
RV LEAD IMPEDENCE ICD: 532 Ohm
TOT-0001: 1
TOT-0002: 0
TOT-0006: 20110209000000
TZAT-0001FASTVT: 1
TZAT-0001SLOWVT: 2
TZAT-0004FASTVT: 8
TZAT-0013FASTVT: 2
TZAT-0019SLOWVT: 8 V
TZAT-0019SLOWVT: 8 V
TZAT-0020FASTVT: 1.5 ms
TZAT-0020SLOWVT: 1.5 ms
TZAT-0020SLOWVT: 1.5 ms
TZON-0003VSLOWVT: 450 ms
TZST-0001FASTVT: 2
TZST-0001FASTVT: 4
TZST-0001SLOWVT: 3
TZST-0001SLOWVT: 5
TZST-0003FASTVT: 35 J
TZST-0003FASTVT: 35 J
TZST-0003FASTVT: 35 J
TZST-0003SLOWVT: 15 J
TZST-0003SLOWVT: 35 J
TZST-0003SLOWVT: 35 J
VENTRICULAR PACING ICD: 0.02 pct

## 2011-11-04 NOTE — Progress Notes (Signed)
ICD remote 

## 2011-11-11 ENCOUNTER — Encounter: Payer: Self-pay | Admitting: *Deleted

## 2011-12-23 ENCOUNTER — Other Ambulatory Visit: Payer: Self-pay | Admitting: Internal Medicine

## 2012-01-28 ENCOUNTER — Ambulatory Visit (INDEPENDENT_AMBULATORY_CARE_PROVIDER_SITE_OTHER): Payer: Medicare Other | Admitting: *Deleted

## 2012-01-28 ENCOUNTER — Encounter: Payer: Self-pay | Admitting: Internal Medicine

## 2012-01-28 DIAGNOSIS — Z95810 Presence of automatic (implantable) cardiac defibrillator: Secondary | ICD-10-CM

## 2012-01-28 DIAGNOSIS — I4891 Unspecified atrial fibrillation: Secondary | ICD-10-CM

## 2012-01-28 DIAGNOSIS — I428 Other cardiomyopathies: Secondary | ICD-10-CM

## 2012-02-01 LAB — REMOTE ICD DEVICE
AL IMPEDENCE ICD: 551 Ohm
ATRIAL PACING ICD: 0.01 pct
BAMS-0001: 170 {beats}/min
BATTERY VOLTAGE: 3.1159 V
CHARGE TIME: 8.568 s
TOT-0001: 1
TOT-0002: 0
TOT-0006: 20110209000000
TZAT-0001FASTVT: 1
TZAT-0001SLOWVT: 1
TZAT-0001SLOWVT: 2
TZAT-0004FASTVT: 8
TZAT-0011SLOWVT: 10 ms
TZAT-0011SLOWVT: 10 ms
TZAT-0018SLOWVT: NEGATIVE
TZAT-0018SLOWVT: NEGATIVE
TZAT-0019SLOWVT: 8 V
TZAT-0019SLOWVT: 8 V
TZAT-0020FASTVT: 1.5 ms
TZAT-0020SLOWVT: 1.5 ms
TZON-0003VSLOWVT: 450 ms
TZON-0004VSLOWVT: 20
TZST-0001FASTVT: 2
TZST-0001FASTVT: 4
TZST-0001FASTVT: 6
TZST-0001SLOWVT: 3
TZST-0001SLOWVT: 4
TZST-0001SLOWVT: 5
TZST-0003FASTVT: 35 J
TZST-0003FASTVT: 35 J
TZST-0003SLOWVT: 15 J
TZST-0003SLOWVT: 35 J
VENTRICULAR PACING ICD: 0.01 pct

## 2012-02-04 ENCOUNTER — Encounter: Payer: Self-pay | Admitting: *Deleted

## 2012-02-04 NOTE — Progress Notes (Signed)
Remote icd check  

## 2012-04-18 ENCOUNTER — Ambulatory Visit (INDEPENDENT_AMBULATORY_CARE_PROVIDER_SITE_OTHER): Payer: Medicare Other | Admitting: Internal Medicine

## 2012-04-18 ENCOUNTER — Encounter: Payer: Self-pay | Admitting: Internal Medicine

## 2012-04-18 VITALS — BP 141/80 | HR 70 | Ht 71.0 in | Wt 228.0 lb

## 2012-04-18 DIAGNOSIS — I472 Ventricular tachycardia: Secondary | ICD-10-CM

## 2012-04-18 DIAGNOSIS — Z95810 Presence of automatic (implantable) cardiac defibrillator: Secondary | ICD-10-CM

## 2012-04-18 DIAGNOSIS — I4891 Unspecified atrial fibrillation: Secondary | ICD-10-CM

## 2012-04-18 LAB — ICD DEVICE OBSERVATION
AL AMPLITUDE: 3.125 mv
AL IMPEDENCE ICD: 551 Ohm
ATRIAL PACING ICD: 0.01 pct
BAMS-0001: 170 {beats}/min
CHARGE TIME: 8.568 s
DEV-0020ICD: NEGATIVE
FVT: 0
RV LEAD AMPLITUDE: 10.625 mv
RV LEAD IMPEDENCE ICD: 551 Ohm
RV LEAD THRESHOLD: 0.75 V
TOT-0006: 20110209000000
TZAT-0001SLOWVT: 1
TZAT-0004FASTVT: 8
TZAT-0004SLOWVT: 8
TZAT-0011SLOWVT: 10 ms
TZAT-0011SLOWVT: 10 ms
TZAT-0012SLOWVT: 200 ms
TZAT-0012SLOWVT: 200 ms
TZAT-0013SLOWVT: 2
TZAT-0013SLOWVT: 2
TZAT-0019FASTVT: 8 V
TZAT-0019SLOWVT: 8 V
TZAT-0019SLOWVT: 8 V
TZAT-0020FASTVT: 1.5 ms
TZAT-0020SLOWVT: 1.5 ms
TZAT-0020SLOWVT: 1.5 ms
TZON-0003VSLOWVT: 450 ms
TZON-0005SLOWVT: 12
TZST-0001FASTVT: 4
TZST-0001FASTVT: 6
TZST-0001SLOWVT: 4
TZST-0001SLOWVT: 5
TZST-0001SLOWVT: 6
TZST-0003FASTVT: 35 J
TZST-0003FASTVT: 35 J
TZST-0003FASTVT: 35 J
TZST-0003SLOWVT: 15 J
TZST-0003SLOWVT: 25 J
TZST-0003SLOWVT: 35 J
VF: 0

## 2012-04-18 NOTE — Progress Notes (Signed)
HPI Jeremy Cobb returns today for followup. He is a very pleasant 78 year old man status post resuscitated ventricular fibrillation arrest, with hypertension, and dyslipidemia. In addition he has a history of atrial fibrillation. We initially had the patient on amiodarone. However he became intolerant to this and it was discontinued. He has had no recurrent atrial or ventricular arrhythmias. He denies chest pain or shortness of breath. He remains active. No Known Allergies   Current Outpatient Prescriptions  Medication Sig Dispense Refill  . aspirin (ASPIR-81) 81 MG EC tablet Take 81 mg by mouth daily.        . digoxin (LANOXIN) 0.125 MG tablet take 1 tablet by mouth once daily  30 tablet  6  . glipiZIDE (GLUCOTROL) 2.5 MG 24 hr tablet Take 2.5 mg by mouth 2 (two) times daily.        Marland Kitchen levothyroxine (LEVOTHROID) 50 MCG tablet Take 50 mcg by mouth daily.        Marland Kitchen omeprazole (PRILOSEC) 40 MG capsule Take 40 mg by mouth 2 (two) times daily.        . potassium chloride (KLOR-CON) 20 MEQ packet Take 20 mEq by mouth daily. Take 1/2 tab       . rosuvastatin (CRESTOR) 40 MG tablet Take 20 mg by mouth daily.          Past Medical History  Diagnosis Date  . CAD (coronary artery disease)     nonobstructive  . Atrial fibrillation   . 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 Diseases of tricuspid valve   . Mitral valve insufficiency and aortic valve insufficiency   . Other and unspecified hyperlipidemia   . Esophageal reflux   . Type II or unspecified type diabetes mellitus without mention of complication, not stated as uncontrolled   . Unspecified disorder of kidney and ureter   . Unspecified hypothyroidism   . Obstructive sleep apnea (adult) (pediatric)   . Osteoarthrosis, unspecified whether generalized or localized, unspecified site   . Asbestosis   . Degeneration of intervertebral disc, site unspecified     ROS:   All systems reviewed and negative except as noted in the  HPI.   Past Surgical History  Procedure Date  . Right total knee arthroplasty Nov 18, 2009     No family history on file.   History   Social History  . Marital Status: Married    Spouse Name: N/A    Number of Children: N/A  . Years of Education: N/A   Occupational History  . Not on file.   Social History Main Topics  . Smoking status: Never Smoker   . Smokeless tobacco: Not on file  . Alcohol Use: Not on file  . Drug Use: Not on file  . Sexually Active: Not on file   Other Topics Concern  . Not on file   Social History Narrative   No ETOH or tobacco abuse.     BP 141/80  Pulse 70  Ht 5\' 11"  (1.803 m)  Wt 228 lb (103.42 kg)  BMI 31.80 kg/m2  Physical Exam:  Well appearing 78 year old man, NAD HEENT: Unremarkable Neck:  No JVD, no thyromegally Lungs:  Clear with no wheezes, rales, or rhonchi. HEART:  Regular rate rhythm, no murmurs, no rubs, no clicks Abd:  soft, positive bowel sounds, no organomegally, no rebound, no guarding Ext:  2 plus pulses, no edema, no cyanosis, no clubbing Skin:  No rashes no nodules Neuro:  CN II through  XII intact, motor grossly intact  DEVICE  Normal device function.  See PaceArt for details.   Assess/Plan:

## 2012-04-18 NOTE — Assessment & Plan Note (Signed)
He has had no recurrent ventricular arrhythmias and is off antiarrhythmic drug therapy. He will undergo watchful waiting.

## 2012-04-18 NOTE — Assessment & Plan Note (Signed)
His device is working normally. We'll plan to recheck in several months. 

## 2012-04-18 NOTE — Assessment & Plan Note (Signed)
He is maintaining sinus rhythm. He'll continue his current medical therapy. 

## 2012-04-18 NOTE — Patient Instructions (Addendum)
Your physician recommends that you continue on your current medications as directed. Please refer to the Current Medication list given to you today.  Your physician wants you to follow-up in: 1 year with Dr. Taylor.  You will receive a reminder letter in the mail two months in advance. If you don't receive a letter, please call our office to schedule the follow-up appointment.  

## 2012-07-25 ENCOUNTER — Ambulatory Visit (INDEPENDENT_AMBULATORY_CARE_PROVIDER_SITE_OTHER): Payer: Medicare Other | Admitting: *Deleted

## 2012-07-25 ENCOUNTER — Encounter: Payer: Self-pay | Admitting: Internal Medicine

## 2012-07-25 DIAGNOSIS — I4729 Other ventricular tachycardia: Secondary | ICD-10-CM

## 2012-07-25 DIAGNOSIS — Z95810 Presence of automatic (implantable) cardiac defibrillator: Secondary | ICD-10-CM

## 2012-07-25 DIAGNOSIS — I472 Ventricular tachycardia: Secondary | ICD-10-CM

## 2012-08-03 LAB — REMOTE ICD DEVICE
AL IMPEDENCE ICD: 532 Ohm
ATRIAL PACING ICD: 0.01 pct
BAMS-0001: 170 {beats}/min
BATTERY VOLTAGE: 3.0955 V
DEV-0020ICD: NEGATIVE
FVT: 0
PACEART VT: 0
TOT-0006: 20110209000000
TZAT-0001FASTVT: 1
TZAT-0004FASTVT: 8
TZAT-0011SLOWVT: 10 ms
TZAT-0011SLOWVT: 10 ms
TZAT-0012FASTVT: 200 ms
TZAT-0012SLOWVT: 200 ms
TZAT-0012SLOWVT: 200 ms
TZAT-0013FASTVT: 2
TZAT-0013SLOWVT: 2
TZAT-0013SLOWVT: 2
TZAT-0018FASTVT: NEGATIVE
TZAT-0018SLOWVT: NEGATIVE
TZAT-0018SLOWVT: NEGATIVE
TZAT-0019SLOWVT: 8 V
TZAT-0019SLOWVT: 8 V
TZAT-0020FASTVT: 1.5 ms
TZON-0003FASTVT: 250 ms
TZON-0003SLOWVT: 340 ms
TZON-0005SLOWVT: 12
TZST-0001FASTVT: 4
TZST-0001FASTVT: 5
TZST-0001SLOWVT: 4
TZST-0003FASTVT: 35 J
TZST-0003FASTVT: 35 J
TZST-0003SLOWVT: 15 J
TZST-0003SLOWVT: 25 J
TZST-0003SLOWVT: 35 J
VENTRICULAR PACING ICD: 0.09 pct
VF: 0

## 2012-08-18 ENCOUNTER — Encounter: Payer: Self-pay | Admitting: *Deleted

## 2012-10-31 ENCOUNTER — Ambulatory Visit (INDEPENDENT_AMBULATORY_CARE_PROVIDER_SITE_OTHER): Payer: Medicare Other | Admitting: *Deleted

## 2012-10-31 DIAGNOSIS — Z95810 Presence of automatic (implantable) cardiac defibrillator: Secondary | ICD-10-CM

## 2012-10-31 DIAGNOSIS — I4729 Other ventricular tachycardia: Secondary | ICD-10-CM

## 2012-10-31 DIAGNOSIS — I472 Ventricular tachycardia: Secondary | ICD-10-CM

## 2012-10-31 LAB — REMOTE ICD DEVICE
BAMS-0001: 170 {beats}/min
BATTERY VOLTAGE: 3.0955 V
CHARGE TIME: 8.708 s
DEV-0020ICD: NEGATIVE
PACEART VT: 0
TZAT-0005SLOWVT: 91 pct
TZAT-0011SLOWVT: 10 ms
TZAT-0011SLOWVT: 10 ms
TZAT-0012FASTVT: 200 ms
TZAT-0012SLOWVT: 200 ms
TZAT-0012SLOWVT: 200 ms
TZAT-0013FASTVT: 2
TZAT-0013SLOWVT: 2
TZAT-0013SLOWVT: 2
TZAT-0018SLOWVT: NEGATIVE
TZAT-0018SLOWVT: NEGATIVE
TZAT-0019FASTVT: 8 V
TZAT-0019SLOWVT: 8 V
TZAT-0019SLOWVT: 8 V
TZAT-0020FASTVT: 1.5 ms
TZON-0003FASTVT: 250 ms
TZON-0004SLOWVT: 16
TZON-0005SLOWVT: 12
TZST-0001FASTVT: 4
TZST-0001SLOWVT: 4
TZST-0001SLOWVT: 6
TZST-0003FASTVT: 35 J
TZST-0003FASTVT: 35 J
TZST-0003SLOWVT: 25 J
TZST-0003SLOWVT: 35 J
VENTRICULAR PACING ICD: 0.01 pct
VF: 0

## 2012-11-02 ENCOUNTER — Encounter: Payer: Self-pay | Admitting: *Deleted

## 2012-11-17 ENCOUNTER — Encounter: Payer: Self-pay | Admitting: Internal Medicine

## 2013-01-30 ENCOUNTER — Other Ambulatory Visit: Payer: Self-pay | Admitting: Internal Medicine

## 2013-01-30 ENCOUNTER — Ambulatory Visit (INDEPENDENT_AMBULATORY_CARE_PROVIDER_SITE_OTHER): Payer: Medicare Other | Admitting: *Deleted

## 2013-01-30 DIAGNOSIS — Z95810 Presence of automatic (implantable) cardiac defibrillator: Secondary | ICD-10-CM

## 2013-01-30 DIAGNOSIS — I472 Ventricular tachycardia: Secondary | ICD-10-CM

## 2013-01-30 DIAGNOSIS — I4729 Other ventricular tachycardia: Secondary | ICD-10-CM

## 2013-02-13 LAB — REMOTE ICD DEVICE
BAMS-0001: 170 {beats}/min
BATTERY VOLTAGE: 3.0614 V
CHARGE TIME: 8.878 s
DEV-0020ICD: NEGATIVE
FVT: 0
PACEART VT: 0
RV LEAD AMPLITUDE: 9.8 mv
TOT-0002: 0
TZAT-0001SLOWVT: 1
TZAT-0001SLOWVT: 2
TZAT-0005SLOWVT: 88 pct
TZAT-0005SLOWVT: 91 pct
TZAT-0011FASTVT: 10 ms
TZAT-0011SLOWVT: 10 ms
TZAT-0012FASTVT: 200 ms
TZAT-0013FASTVT: 2
TZAT-0013SLOWVT: 2
TZAT-0013SLOWVT: 2
TZAT-0018FASTVT: NEGATIVE
TZAT-0018SLOWVT: NEGATIVE
TZAT-0018SLOWVT: NEGATIVE
TZAT-0019FASTVT: 8 V
TZAT-0019SLOWVT: 8 V
TZAT-0019SLOWVT: 8 V
TZAT-0020FASTVT: 1.5 ms
TZON-0003FASTVT: 250 ms
TZON-0004SLOWVT: 16
TZON-0005SLOWVT: 12
TZST-0001FASTVT: 3
TZST-0001FASTVT: 4
TZST-0001FASTVT: 6
TZST-0001SLOWVT: 3
TZST-0001SLOWVT: 4
TZST-0001SLOWVT: 6
TZST-0003FASTVT: 35 J
TZST-0003FASTVT: 35 J
TZST-0003SLOWVT: 25 J
TZST-0003SLOWVT: 35 J
VENTRICULAR PACING ICD: 12.06 pct

## 2013-03-09 ENCOUNTER — Encounter: Payer: Self-pay | Admitting: *Deleted

## 2013-04-05 ENCOUNTER — Encounter: Payer: Self-pay | Admitting: Internal Medicine

## 2013-04-25 ENCOUNTER — Encounter: Payer: Self-pay | Admitting: Internal Medicine

## 2013-04-25 ENCOUNTER — Ambulatory Visit (INDEPENDENT_AMBULATORY_CARE_PROVIDER_SITE_OTHER): Payer: Medicare Other | Admitting: Internal Medicine

## 2013-04-25 VITALS — BP 118/72 | HR 70 | Ht 71.0 in | Wt 212.2 lb

## 2013-04-25 DIAGNOSIS — Z95810 Presence of automatic (implantable) cardiac defibrillator: Secondary | ICD-10-CM

## 2013-04-25 DIAGNOSIS — I472 Ventricular tachycardia: Secondary | ICD-10-CM

## 2013-04-25 DIAGNOSIS — I4891 Unspecified atrial fibrillation: Secondary | ICD-10-CM

## 2013-04-25 LAB — ICD DEVICE OBSERVATION
BATTERY VOLTAGE: 3.0341 V
BRDY-0003RV: 130 {beats}/min
BRDY-0004RV: 120 {beats}/min
CHARGE TIME: 8.878 s
DEV-0020ICD: NEGATIVE
PACEART VT: 0
RV LEAD THRESHOLD: 0.75 V
TOT-0001: 1
TOT-0002: 0
TZAT-0001FASTVT: 1
TZAT-0004FASTVT: 8
TZAT-0012SLOWVT: 200 ms
TZAT-0012SLOWVT: 200 ms
TZAT-0013FASTVT: 2
TZAT-0018FASTVT: NEGATIVE
TZAT-0018SLOWVT: NEGATIVE
TZAT-0018SLOWVT: NEGATIVE
TZAT-0019FASTVT: 8 V
TZAT-0019SLOWVT: 8 V
TZAT-0019SLOWVT: 8 V
TZAT-0020FASTVT: 1.5 ms
TZAT-0020SLOWVT: 1.5 ms
TZAT-0020SLOWVT: 1.5 ms
TZON-0003VSLOWVT: 450 ms
TZON-0005SLOWVT: 12
TZST-0001FASTVT: 2
TZST-0001FASTVT: 4
TZST-0001FASTVT: 6
TZST-0001SLOWVT: 5
TZST-0003FASTVT: 35 J
TZST-0003FASTVT: 35 J
TZST-0003SLOWVT: 15 J
TZST-0003SLOWVT: 35 J
VENTRICULAR PACING ICD: 6.19 pct

## 2013-04-25 MED ORDER — APIXABAN 5 MG PO TABS: 5.0000 mg | ORAL_TABLET | Freq: Two times a day (BID) | ORAL | Status: DC

## 2013-04-25 NOTE — Patient Instructions (Addendum)
Your physician has recommended you make the following change in your medication: start taking Eliquis 5 mg twice daily  Your physician wants you to follow-up in: 12 months. You will receive a reminder letter in the mail two months in advance. If you don't receive a letter, please call our office to schedule the follow-up appointment.  Remote monitoring is used to monitor your Pacemaker of ICD from home. This monitoring reduces the number of office visits required to check your device to one time per year. It allows [place] to keep an eye on the functioning of your device to ensure it is working properly. You are scheduled for a device check from home on July 31, 2013. You may send your transmission at any time that day. If you have a wireless device, the transmission will be sent automatically. After your physician reviews your transmission, you will receive a postcard with your next transmission date.

## 2013-04-25 NOTE — Assessment & Plan Note (Signed)
He has had no recurrent ventricular tachycardia. He will undergo watchful waiting.

## 2013-04-25 NOTE — Progress Notes (Signed)
HPI [name] returns today for followup. He is a very pleasant 79 year old man with a history of resuscitated ventricular fibrillation cardiac arrest. He is status post ICD implantation. He has a history of aortic insufficiency and coronary disease. He has relatively preserved left ventricular function. He has atrial fibrillation. I saw the patient last, he was maintaining sinus rhythm but had been off of his amiodarone. He was intolerant to this medication. The patient has gone into atrial fibrillation and has been there for the last 3 months based on ICD interrogation. He is asymptomatic. He denies syncope, chest pain, or shortness of breath. No Known Allergies   Current Outpatient Prescriptions  Medication Sig Dispense Refill  . aspirin (ASPIR-81) 81 MG EC tablet Take 81 mg by mouth daily.        . digoxin (LANOXIN) 0.125 MG tablet take 1 tablet by mouth once daily  30 tablet  6  . glipiZIDE (GLUCOTROL) 2.5 MG 24 hr tablet Take 2.5 mg by mouth 2 (two) times daily.        Marland Kitchen levothyroxine (LEVOTHROID) 50 MCG tablet Take 50 mcg by mouth daily.        Marland Kitchen omeprazole (PRILOSEC) 40 MG capsule Take 40 mg by mouth daily.       . rosuvastatin (CRESTOR) 40 MG tablet Take 20 mg by mouth daily.        No current facility-administered medications for this visit.     Past Medical History  Diagnosis Date  . CAD (coronary artery disease)     nonobstructive  . Atrial fibrillation   . 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 Diseases of tricuspid valve   . Mitral valve insufficiency and aortic valve insufficiency   . Other and unspecified hyperlipidemia   . Esophageal reflux   . Type II or unspecified type diabetes mellitus without mention of complication, not stated as uncontrolled   . Unspecified disorder of kidney and ureter   . Unspecified hypothyroidism   . Obstructive sleep apnea (adult) (pediatric)   . Osteoarthrosis, unspecified whether generalized or localized, unspecified  site   . Asbestosis(501)   . Degeneration of intervertebral disc, site unspecified     ROS:   All systems reviewed and negative except as noted in the HPI.   Past Surgical History  Procedure Laterality Date  . Right total knee arthroplasty  Nov 18, 2009     No family history on file.   History   Social History  . Marital Status: Married    Spouse Name: N/A    Number of Children: N/A  . Years of Education: N/A   Occupational History  . Not on file.   Social History Main Topics  . Smoking status: Never Smoker   . Smokeless tobacco: Not on file  . Alcohol Use: Not on file  . Drug Use: Not on file  . Sexually Active: Not on file   Other Topics Concern  . Not on file   Social History Narrative   No ETOH or tobacco abuse.     BP 118/72  Pulse 70  Ht 5\' 11"  (1.803 m)  Wt 212 lb 3.2 oz (96.253 kg)  BMI 29.61 kg/m2  Physical Exam:  Well appearing NAD HEENT: Unremarkable Neck:  N7 cmJVD, no thyromegally Lungs:  Clear with no wheezes, rales, or rhonchi. HEART:  IRegular rate rhythm, no murmurs, no rubs, no clicks Abd:  soft, positive bowel sounds, no organomegally, no rebound, no guarding Ext:  2 plus pulses, no edema, no cyanosis, no clubbing Skin:  No rashes no nodules Neuro:  CN II through XII intact, motor grossly intact  DEVICE  Normal device function.  See PaceArt for details.   Assess/Plan:

## 2013-04-25 NOTE — Assessment & Plan Note (Signed)
His Medtronic dual-chamber ICD is working normally. We'll plan to recheck in several months. 

## 2013-04-25 NOTE — Assessment & Plan Note (Signed)
He is now chronically in atrial fibrillation but is asymptomatic. I discussed his stroke risk in detail. I've recommended that he be placed on systemic anticoagulation for thromboembolic protection. Previously, he was taking Pradaxa but developed GI side effects. He refuses coumadin but is willing to take Eliquis. Will start this today.

## 2013-07-31 ENCOUNTER — Ambulatory Visit (INDEPENDENT_AMBULATORY_CARE_PROVIDER_SITE_OTHER): Payer: Medicare Other | Admitting: *Deleted

## 2013-07-31 DIAGNOSIS — Z95810 Presence of automatic (implantable) cardiac defibrillator: Secondary | ICD-10-CM

## 2013-07-31 DIAGNOSIS — I472 Ventricular tachycardia, unspecified: Secondary | ICD-10-CM

## 2013-08-01 LAB — REMOTE ICD DEVICE
AL IMPEDENCE ICD: 551 Ohm
ATRIAL PACING ICD: 0.05 pct
BAMS-0001: 170 {beats}/min
CHARGE TIME: 9.168 s
DEV-0020ICD: NEGATIVE
PACEART VT: 0
TOT-0001: 1
TOT-0002: 0
TOT-0006: 20110209000000
TZAT-0001FASTVT: 1
TZAT-0001SLOWVT: 1
TZAT-0001SLOWVT: 2
TZAT-0004FASTVT: 8
TZAT-0011SLOWVT: 10 ms
TZAT-0012FASTVT: 200 ms
TZAT-0013FASTVT: 2
TZAT-0018SLOWVT: NEGATIVE
TZAT-0018SLOWVT: NEGATIVE
TZAT-0019FASTVT: 8 V
TZAT-0019SLOWVT: 8 V
TZAT-0019SLOWVT: 8 V
TZAT-0020FASTVT: 1.5 ms
TZAT-0020SLOWVT: 1.5 ms
TZAT-0020SLOWVT: 1.5 ms
TZON-0005SLOWVT: 12
TZST-0001FASTVT: 2
TZST-0001FASTVT: 4
TZST-0001FASTVT: 6
TZST-0001SLOWVT: 3
TZST-0001SLOWVT: 4
TZST-0001SLOWVT: 5
TZST-0003FASTVT: 35 J
TZST-0003FASTVT: 35 J
TZST-0003FASTVT: 35 J
TZST-0003FASTVT: 35 J
TZST-0003SLOWVT: 35 J
VENTRICULAR PACING ICD: 34.58 pct

## 2013-08-09 ENCOUNTER — Encounter: Payer: Self-pay | Admitting: *Deleted

## 2013-08-25 ENCOUNTER — Encounter: Payer: Self-pay | Admitting: Internal Medicine

## 2013-10-04 ENCOUNTER — Other Ambulatory Visit: Payer: Self-pay | Admitting: *Deleted

## 2013-10-04 DIAGNOSIS — I4891 Unspecified atrial fibrillation: Secondary | ICD-10-CM

## 2013-10-04 MED ORDER — APIXABAN 5 MG PO TABS: 5.0000 mg | ORAL_TABLET | Freq: Two times a day (BID) | ORAL | Status: DC

## 2013-11-01 ENCOUNTER — Encounter: Payer: Medicare Other | Admitting: *Deleted

## 2013-11-01 DIAGNOSIS — I4729 Other ventricular tachycardia: Secondary | ICD-10-CM

## 2013-11-01 DIAGNOSIS — I472 Ventricular tachycardia: Secondary | ICD-10-CM

## 2013-11-01 DIAGNOSIS — Z95810 Presence of automatic (implantable) cardiac defibrillator: Secondary | ICD-10-CM

## 2013-11-01 LAB — MDC_IDC_ENUM_SESS_TYPE_REMOTE
Battery Voltage: 3.01 V
Brady Statistic AP VP Percent: 0.04 %
Brady Statistic AP VS Percent: 0.01 %
Brady Statistic AS VP Percent: 37.42 %
Brady Statistic RA Percent Paced: 0.04 %
Brady Statistic RV Percent Paced: 37.46 %
Date Time Interrogation Session: 20150114062408
HIGH POWER IMPEDANCE MEASURED VALUE: 63 Ohm
HighPow Impedance: 44 Ohm
Lead Channel Impedance Value: 551 Ohm
Lead Channel Impedance Value: 589 Ohm
Lead Channel Sensing Intrinsic Amplitude: 1.125 mV
Lead Channel Sensing Intrinsic Amplitude: 1.125 mV
Lead Channel Setting Pacing Amplitude: 2.5 V
Lead Channel Setting Pacing Pulse Width: 0.4 ms
Lead Channel Setting Sensing Sensitivity: 0.3 mV
MDC IDC MSMT LEADCHNL RV SENSING INTR AMPL: 9.875 mV
MDC IDC MSMT LEADCHNL RV SENSING INTR AMPL: 9.875 mV
MDC IDC SET LEADCHNL RA PACING AMPLITUDE: 2 V
MDC IDC SET ZONE DETECTION INTERVAL: 340 ms
MDC IDC SET ZONE DETECTION INTERVAL: 450 ms
MDC IDC STAT BRADY AS VS PERCENT: 62.53 %
Zone Setting Detection Interval: 250 ms
Zone Setting Detection Interval: 300 ms
Zone Setting Detection Interval: 350 ms

## 2013-11-14 ENCOUNTER — Encounter: Payer: Self-pay | Admitting: *Deleted

## 2013-11-17 ENCOUNTER — Encounter: Payer: Self-pay | Admitting: Internal Medicine

## 2014-02-02 ENCOUNTER — Ambulatory Visit (INDEPENDENT_AMBULATORY_CARE_PROVIDER_SITE_OTHER): Payer: Medicare Other | Admitting: *Deleted

## 2014-02-02 DIAGNOSIS — I4729 Other ventricular tachycardia: Secondary | ICD-10-CM

## 2014-02-02 DIAGNOSIS — I472 Ventricular tachycardia: Secondary | ICD-10-CM

## 2014-02-06 LAB — MDC_IDC_ENUM_SESS_TYPE_REMOTE
Brady Statistic AP VP Percent: 0.1 % — CL
Brady Statistic AP VS Percent: 0.1 % — CL
Brady Statistic AS VP Percent: 38.1 %
Lead Channel Impedance Value: 551 Ohm
Lead Channel Sensing Intrinsic Amplitude: 9.3 mV
Lead Channel Setting Pacing Amplitude: 2 V
Lead Channel Setting Pacing Amplitude: 2.5 V
Lead Channel Setting Sensing Sensitivity: 0.3 mV
MDC IDC MSMT BATTERY VOLTAGE: 2.99 V
MDC IDC MSMT LEADCHNL RA SENSING INTR AMPL: 2 mV
MDC IDC MSMT LEADCHNL RV IMPEDANCE VALUE: 532 Ohm
MDC IDC SET LEADCHNL RV PACING PULSEWIDTH: 0.4 ms
MDC IDC SET ZONE DETECTION INTERVAL: 300 ms
MDC IDC SET ZONE DETECTION INTERVAL: 340 ms
MDC IDC STAT BRADY AS VS PERCENT: 61.9 %
Zone Setting Detection Interval: 250 ms
Zone Setting Detection Interval: 350 ms
Zone Setting Detection Interval: 450 ms

## 2014-02-20 ENCOUNTER — Encounter: Payer: Self-pay | Admitting: Cardiology

## 2014-02-28 ENCOUNTER — Encounter: Payer: Self-pay | Admitting: Internal Medicine

## 2014-04-16 ENCOUNTER — Ambulatory Visit (HOSPITAL_COMMUNITY): Payer: Medicare Other | Attending: Cardiovascular Disease | Admitting: Cardiology

## 2014-04-16 ENCOUNTER — Other Ambulatory Visit (HOSPITAL_COMMUNITY): Payer: Self-pay | Admitting: Cardiology

## 2014-04-16 DIAGNOSIS — M79606 Pain in leg, unspecified: Secondary | ICD-10-CM

## 2014-04-16 DIAGNOSIS — M79609 Pain in unspecified limb: Secondary | ICD-10-CM | POA: Diagnosis not present

## 2014-04-16 DIAGNOSIS — M7989 Other specified soft tissue disorders: Secondary | ICD-10-CM | POA: Diagnosis not present

## 2014-04-16 NOTE — Progress Notes (Signed)
Unilateral lower venous duplex completed.  

## 2014-05-24 ENCOUNTER — Other Ambulatory Visit: Payer: Self-pay | Admitting: Internal Medicine

## 2014-06-04 ENCOUNTER — Encounter: Payer: Self-pay | Admitting: Internal Medicine

## 2014-06-04 ENCOUNTER — Ambulatory Visit (INDEPENDENT_AMBULATORY_CARE_PROVIDER_SITE_OTHER): Payer: Medicare Other | Admitting: Internal Medicine

## 2014-06-04 VITALS — BP 152/93 | HR 61 | Ht 71.0 in | Wt 201.0 lb

## 2014-06-04 DIAGNOSIS — I2510 Atherosclerotic heart disease of native coronary artery without angina pectoris: Secondary | ICD-10-CM

## 2014-06-04 DIAGNOSIS — Z95810 Presence of automatic (implantable) cardiac defibrillator: Secondary | ICD-10-CM

## 2014-06-04 DIAGNOSIS — I428 Other cardiomyopathies: Secondary | ICD-10-CM

## 2014-06-04 DIAGNOSIS — I482 Chronic atrial fibrillation, unspecified: Secondary | ICD-10-CM

## 2014-06-04 DIAGNOSIS — I4891 Unspecified atrial fibrillation: Secondary | ICD-10-CM

## 2014-06-04 LAB — MDC_IDC_ENUM_SESS_TYPE_INCLINIC
Battery Voltage: 2.92 V
Brady Statistic AS VS Percent: 63.98 %
Brady Statistic RA Percent Paced: 0.04 %
Date Time Interrogation Session: 20150817122451
HighPow Impedance: 44 Ohm
HighPow Impedance: 56 Ohm
Lead Channel Impedance Value: 532 Ohm
Lead Channel Impedance Value: 589 Ohm
Lead Channel Pacing Threshold Amplitude: 0.5 V
Lead Channel Pacing Threshold Pulse Width: 0.4 ms
Lead Channel Sensing Intrinsic Amplitude: 9.125 mV
Lead Channel Setting Pacing Amplitude: 2.5 V
Lead Channel Setting Pacing Pulse Width: 0.4 ms
MDC IDC MSMT LEADCHNL RA SENSING INTR AMPL: 1.125 mV
MDC IDC MSMT LEADCHNL RA SENSING INTR AMPL: 1.125 mV
MDC IDC MSMT LEADCHNL RV SENSING INTR AMPL: 10.75 mV
MDC IDC SET LEADCHNL RV SENSING SENSITIVITY: 0.3 mV
MDC IDC SET ZONE DETECTION INTERVAL: 250 ms
MDC IDC SET ZONE DETECTION INTERVAL: 300 ms
MDC IDC STAT BRADY AP VP PERCENT: 0.04 %
MDC IDC STAT BRADY AP VS PERCENT: 0.01 %
MDC IDC STAT BRADY AS VP PERCENT: 35.97 %
MDC IDC STAT BRADY RV PERCENT PACED: 36.01 %
Zone Setting Detection Interval: 340 ms
Zone Setting Detection Interval: 350 ms
Zone Setting Detection Interval: 450 ms

## 2014-06-04 NOTE — Patient Instructions (Signed)
Remote device check due 09/03/14  Your physician wants you to follow-up in: 1 YEAR with Dr Ladona Ridgel.  You will receive a reminder letter in the mail two months in advance. If you don't receive a letter, please call our office to schedule the follow-up appointment.  Your physician recommends that you continue on your current medications as directed. Please refer to the Current Medication list given to you today.

## 2014-06-04 NOTE — Progress Notes (Signed)
HPI Jeremy Cobb returns today for followup. He is a very pleasant 80 year old man with a history of resuscitated ventricular fibrillation cardiac arrest. He is status post ICD implantation. He has a history of aortic insufficiency and coronary disease. He has relatively preserved left ventricular function. He has atrial fibrillation. When I saw the patient last, he had stopped his amiodarone and had reverted back to atrial fibrillation. Since then he has been in atrial fibrillation 100% of the time.  He is asymptomatic. He denies syncope, chest pain, or shortness of breath. No ICD shock. No Known Allergies   Current Outpatient Prescriptions  Medication Sig Dispense Refill  . aspirin (ASPIR-81) 81 MG EC tablet Take 81 mg by mouth daily.        . digoxin (LANOXIN) 0.125 MG tablet take 1 tablet by mouth once daily  30 tablet  6  . ELIQUIS 5 MG TABS tablet TAKE 1 TABLET TWO TIMES DAILY  180 tablet  0  . glipiZIDE (GLUCOTROL) 2.5 MG 24 hr tablet Take 2.5 mg by mouth 2 (two) times daily.        Marland Kitchen HYDROcodone-acetaminophen (NORCO/VICODIN) 5-325 MG per tablet Take 1 tablet by mouth every 6 (six) hours as needed for moderate pain (1/2 tab as needed).      Marland Kitchen levothyroxine (LEVOTHROID) 50 MCG tablet Take 50 mcg by mouth daily.        Marland Kitchen omeprazole (PRILOSEC) 40 MG capsule Take 40 mg by mouth daily.       . rosuvastatin (CRESTOR) 40 MG tablet Take 20 mg by mouth daily.        No current facility-administered medications for this visit.     Past Medical History  Diagnosis Date  . CAD (coronary artery disease)     nonobstructive  . Atrial fibrillation   . 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 Diseases of tricuspid valve   . Mitral valve insufficiency and aortic valve insufficiency   . Other and unspecified hyperlipidemia   . Esophageal reflux   . Type II or unspecified type diabetes mellitus without mention of complication, not stated as uncontrolled   . Unspecified disorder of  kidney and ureter   . Unspecified hypothyroidism   . Obstructive sleep apnea (adult) (pediatric)   . Osteoarthrosis, unspecified whether generalized or localized, unspecified site   . Asbestosis(501)   . Degeneration of intervertebral disc, site unspecified     ROS:   All systems reviewed and negative except as noted in the HPI.   Past Surgical History  Procedure Laterality Date  . Right total knee arthroplasty  Nov 18, 2009     Family History  Problem Relation Age of Onset  . Diabetes Father   . Stroke Father   . Diabetes Sister   . Anuerysm Brother      History   Social History  . Marital Status: Married    Spouse Name: N/A    Number of Children: N/A  . Years of Education: N/A   Occupational History  . Not on file.   Social History Main Topics  . Smoking status: Never Smoker   . Smokeless tobacco: Not on file  . Alcohol Use: Not on file  . Drug Use: Not on file  . Sexual Activity: Not on file   Other Topics Concern  . Not on file   Social History Narrative   No ETOH or tobacco abuse.     BP 152/93  Pulse 61  Ht 5'  11" (1.803 m)  Wt 201 lb (91.173 kg)  BMI 28.05 kg/m2  Physical Exam:  Well appearing 80 year old man, who looks younger than his stated age, NAD HEENT: Unremarkable Neck:  7 cm JVD, no thyromegally Lungs:  Clear with no wheezes, rales, or rhonchi. HEART:  IRegular rate rhythm, no murmurs, no rubs, no clicks Abd:  soft, positive bowel sounds, no organomegally, no rebound, no guarding Ext:  2 plus pulses, no edema, no cyanosis, no clubbing Skin:  No rashes no nodules Neuro:  CN II through XII intact, motor grossly intact  DEVICE  Normal device function.  See PaceArt for details.   Assess/Plan:

## 2014-06-04 NOTE — Assessment & Plan Note (Signed)
He denies anginal symptoms. He remains his active lifestyle.

## 2014-06-04 NOTE — Assessment & Plan Note (Signed)
Interrogation of his Medtronic dual-chamber ICD demonstrates normal function. We have reprogrammed the device to the VVI mode. We'll plan to recheck his device in several months.

## 2014-06-04 NOTE — Assessment & Plan Note (Signed)
His ventricular rate is well controlled. He will continue systemic anticoagulation, and maintain a strategy of rate control.

## 2014-07-06 ENCOUNTER — Encounter: Payer: Self-pay | Admitting: Internal Medicine

## 2014-08-04 ENCOUNTER — Other Ambulatory Visit: Payer: Self-pay | Admitting: Internal Medicine

## 2014-08-04 NOTE — Telephone Encounter (Signed)
Rx was sent to pharmacy electronically. 

## 2014-08-09 ENCOUNTER — Telehealth: Payer: Self-pay | Admitting: Internal Medicine

## 2014-08-09 NOTE — Telephone Encounter (Signed)
New message  Pt is having a colonoscopy will need permission to hold Eliquis 4 days before the procedure. Please call

## 2014-08-09 NOTE — Telephone Encounter (Signed)
Dr Ladona Ridgel said okay to hold 24 hours prior and restart the next day.

## 2014-08-10 NOTE — Telephone Encounter (Signed)
  Spoke with [place] and she will let the MD know

## 2014-09-03 ENCOUNTER — Ambulatory Visit (INDEPENDENT_AMBULATORY_CARE_PROVIDER_SITE_OTHER): Payer: Medicare Other | Admitting: *Deleted

## 2014-09-03 ENCOUNTER — Telehealth: Payer: Self-pay | Admitting: Cardiology

## 2014-09-03 DIAGNOSIS — I4729 Other ventricular tachycardia: Secondary | ICD-10-CM

## 2014-09-03 DIAGNOSIS — I472 Ventricular tachycardia: Secondary | ICD-10-CM

## 2014-09-03 NOTE — Telephone Encounter (Signed)
Spoke with pt and reminded pt of remote transmission that is due today. Pt verbalized understanding.   

## 2014-09-04 DIAGNOSIS — I472 Ventricular tachycardia: Secondary | ICD-10-CM

## 2014-09-04 LAB — MDC_IDC_ENUM_SESS_TYPE_REMOTE
Battery Voltage: 2.93 V
Brady Statistic AP VP Percent: 0 %
Brady Statistic AP VS Percent: 0 %
Brady Statistic AS VP Percent: 7.75 %
Brady Statistic AS VS Percent: 92.25 %
Brady Statistic RA Percent Paced: 0 %
Brady Statistic RV Percent Paced: 7.75 %
HIGH POWER IMPEDANCE MEASURED VALUE: 43 Ohm
HIGH POWER IMPEDANCE MEASURED VALUE: 57 Ohm
Lead Channel Impedance Value: 494 Ohm
Lead Channel Sensing Intrinsic Amplitude: 1 mV
Lead Channel Sensing Intrinsic Amplitude: 1 mV
Lead Channel Sensing Intrinsic Amplitude: 9.875 mV
Lead Channel Setting Pacing Amplitude: 2.5 V
Lead Channel Setting Pacing Pulse Width: 0.4 ms
MDC IDC MSMT LEADCHNL RA IMPEDANCE VALUE: 589 Ohm
MDC IDC MSMT LEADCHNL RV SENSING INTR AMPL: 9.875 mV
MDC IDC SESS DTM: 20151117153504
MDC IDC SET LEADCHNL RV SENSING SENSITIVITY: 0.3 mV
MDC IDC SET ZONE DETECTION INTERVAL: 450 ms
Zone Setting Detection Interval: 250 ms
Zone Setting Detection Interval: 300 ms
Zone Setting Detection Interval: 340 ms
Zone Setting Detection Interval: 350 ms

## 2014-09-04 NOTE — Progress Notes (Signed)
Remote ICD transmission.   

## 2014-09-12 ENCOUNTER — Encounter: Payer: Self-pay | Admitting: Cardiology

## 2014-09-19 ENCOUNTER — Encounter: Payer: Self-pay | Admitting: Internal Medicine

## 2014-12-04 ENCOUNTER — Ambulatory Visit (INDEPENDENT_AMBULATORY_CARE_PROVIDER_SITE_OTHER): Payer: Medicare Other | Admitting: *Deleted

## 2014-12-04 ENCOUNTER — Telehealth: Payer: Self-pay | Admitting: Internal Medicine

## 2014-12-04 DIAGNOSIS — I4729 Other ventricular tachycardia: Secondary | ICD-10-CM

## 2014-12-04 DIAGNOSIS — I472 Ventricular tachycardia: Secondary | ICD-10-CM

## 2014-12-04 LAB — MDC_IDC_ENUM_SESS_TYPE_REMOTE
Battery Voltage: 2.86 V
Brady Statistic RA Percent Paced: 0 %
Brady Statistic RV Percent Paced: 6.4 %
HighPow Impedance: 42 Ohm
Lead Channel Sensing Intrinsic Amplitude: 1.3 mV
Lead Channel Sensing Intrinsic Amplitude: 10.9 mV
Lead Channel Setting Pacing Pulse Width: 0.4 ms
Lead Channel Setting Sensing Sensitivity: 0.3 mV
MDC IDC MSMT LEADCHNL RA IMPEDANCE VALUE: 589 Ohm
MDC IDC MSMT LEADCHNL RV IMPEDANCE VALUE: 494 Ohm
MDC IDC SET LEADCHNL RV PACING AMPLITUDE: 2.5 V
MDC IDC SET ZONE DETECTION INTERVAL: 350 ms
MDC IDC SET ZONE DETECTION INTERVAL: 450 ms
Zone Setting Detection Interval: 250 ms
Zone Setting Detection Interval: 300 ms
Zone Setting Detection Interval: 340 ms

## 2014-12-04 NOTE — Progress Notes (Signed)
Remote ICD transmission.   

## 2014-12-04 NOTE — Telephone Encounter (Signed)
Transmission received, patient aware. 

## 2014-12-04 NOTE — Telephone Encounter (Signed)
New Msg          Pt is having trouble getting his device check to go through.   States there is a weird noise.   Please return call.

## 2014-12-14 ENCOUNTER — Encounter: Payer: Self-pay | Admitting: *Deleted

## 2014-12-25 ENCOUNTER — Encounter: Payer: Self-pay | Admitting: Internal Medicine

## 2015-01-02 ENCOUNTER — Telehealth: Payer: Self-pay | Admitting: Internal Medicine

## 2015-01-02 NOTE — Telephone Encounter (Signed)
New Msg        Pt is getting a pounding feeling in his ears and sharp pain under his right arm.   Does he need to send in another transmission?    Please return pt call about concern.

## 2015-01-02 NOTE — Telephone Encounter (Signed)
Left message for patient to return my call and that he can send transmission also but to call me back

## 2015-01-14 NOTE — Telephone Encounter (Signed)
Left message for patient to call me back if he is still having problems  I have tried calling without success

## 2015-01-16 ENCOUNTER — Telehealth: Payer: Self-pay | Admitting: Internal Medicine

## 2015-01-16 NOTE — Telephone Encounter (Signed)
Returned patient's call and he says he has a "swishing" sound in his ears at night when he lays down and has been having some SOB now that the weather is nice and he is out trying to do things more.  Just wants to be seen next available with Dr. Ladona Ridgel

## 2015-01-16 NOTE — Telephone Encounter (Signed)
New message ° ° ° ° °Returning Jeremy Cobb's call °

## 2015-01-22 NOTE — Telephone Encounter (Signed)
Apt scheduled for 01/29/15

## 2015-01-29 ENCOUNTER — Encounter: Payer: Self-pay | Admitting: Internal Medicine

## 2015-01-29 ENCOUNTER — Ambulatory Visit (INDEPENDENT_AMBULATORY_CARE_PROVIDER_SITE_OTHER): Payer: Medicare Other | Admitting: Internal Medicine

## 2015-01-29 VITALS — BP 146/84 | HR 52 | Ht 71.0 in | Wt 204.0 lb

## 2015-01-29 DIAGNOSIS — Z95810 Presence of automatic (implantable) cardiac defibrillator: Secondary | ICD-10-CM

## 2015-01-29 DIAGNOSIS — H833X9 Noise effects on inner ear, unspecified ear: Secondary | ICD-10-CM | POA: Insufficient documentation

## 2015-01-29 DIAGNOSIS — I4729 Other ventricular tachycardia: Secondary | ICD-10-CM

## 2015-01-29 DIAGNOSIS — I2510 Atherosclerotic heart disease of native coronary artery without angina pectoris: Secondary | ICD-10-CM

## 2015-01-29 DIAGNOSIS — I472 Ventricular tachycardia: Secondary | ICD-10-CM | POA: Diagnosis not present

## 2015-01-29 DIAGNOSIS — H833X3 Noise effects on inner ear, bilateral: Secondary | ICD-10-CM

## 2015-01-29 DIAGNOSIS — I482 Chronic atrial fibrillation, unspecified: Secondary | ICD-10-CM

## 2015-01-29 DIAGNOSIS — I4891 Unspecified atrial fibrillation: Secondary | ICD-10-CM

## 2015-01-29 HISTORY — DX: Noise effects on inner ear, unspecified ear: H83.3X9

## 2015-01-29 NOTE — Assessment & Plan Note (Signed)
His ventricular rate is well controlled. He will continue his current meds. 

## 2015-01-29 NOTE — Assessment & Plan Note (Signed)
He has had no angina symptoms. Will follow.

## 2015-01-29 NOTE — Assessment & Plan Note (Signed)
He's had no recurrent ventricular arrhythmias. Will follow.

## 2015-01-29 NOTE — Progress Notes (Signed)
HPI Jeremy Cobb returns today for followup. He is a very pleasant 81 year old man with a history of resuscitated ventricular fibrillation cardiac arrest. He is status post ICD implantation. He has a history of aortic insufficiency and coronary disease. He has relatively preserved left ventricular function. He has atrial fibrillation. When I saw the patient last, he had stopped his amiodarone and had reverted back to atrial fibrillation. Since then he has been in atrial fibrillation 100% of the time.  He is asymptomatic. He denies syncope, chest pain, or shortness of breath. No ICD shock. His only complaint today is the sound of his heart beating in his ears, especially when he lays down to sleep at night. He notes that it has gotten better in the past month. He states that he has had an ultrasound of his neck back in [place] and was told that there was no blockage.  No Known Allergies   Current Outpatient Prescriptions  Medication Sig Dispense Refill  . digoxin (LANOXIN) 0.125 MG tablet take 1 tablet by mouth once daily 30 tablet 6  . ELIQUIS 5 MG TABS tablet TAKE 1 TABLET TWICE A DAY 180 tablet 1  . ergocalciferol (VITAMIN D2) 50000 UNITS capsule Take 50,000 Units by mouth once a week.    Marland Kitchen glipiZIDE (GLUCOTROL) 2.5 MG 24 hr tablet Take 2.5 mg by mouth 2 (two) times daily.      Marland Kitchen HYDROcodone-acetaminophen (NORCO/VICODIN) 5-325 MG per tablet Take 1 tablet by mouth every 6 (six) hours as needed for moderate pain (1/2 tab as needed).    Marland Kitchen levothyroxine (LEVOTHROID) 50 MCG tablet Take 50 mcg by mouth daily.      Marland Kitchen omeprazole (PRILOSEC) 40 MG capsule Take 40 mg by mouth daily.     . rosuvastatin (CRESTOR) 40 MG tablet Take 20 mg by mouth daily.      No current facility-administered medications for this visit.     Past Medical History  Diagnosis Date  . CAD (coronary artery disease)     nonobstructive  . Atrial fibrillation   . 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 Diseases  of tricuspid valve   . Mitral valve insufficiency and aortic valve insufficiency   . Other and unspecified hyperlipidemia   . Esophageal reflux   . Type II or unspecified type diabetes mellitus without mention of complication, not stated as uncontrolled   . Unspecified disorder of kidney and ureter   . Unspecified hypothyroidism   . Obstructive sleep apnea (adult) (pediatric)   . Osteoarthrosis, unspecified whether generalized or localized, unspecified site   . Asbestosis(501)   . Degeneration of intervertebral disc, site unspecified     ROS:   All systems reviewed and negative except as noted in the HPI.   Past Surgical History  Procedure Laterality Date  . Right total knee arthroplasty  Nov 18, 2009     Family History  Problem Relation Age of Onset  . Diabetes Father   . Stroke Father   . Diabetes Sister   . Anuerysm Brother      History   Social History  . Marital Status: Married    Spouse Name: N/A  . Number of Children: N/A  . Years of Education: N/A   Occupational History  . Not on file.   Social History Main Topics  . Smoking status: Never Smoker   . Smokeless tobacco: Not on file  . Alcohol Use: Not on file  . Drug Use: Not on file  .  Sexual Activity: Not on file   Other Topics Concern  . Not on file   Social History Narrative   No ETOH or tobacco abuse.     BP 146/84 mmHg  Pulse 52  Ht 5\' 11"  (1.803 m)  Wt 204 lb (92.534 kg)  BMI 28.46 kg/m2  Physical Exam:  Well appearing 81 year old man, who looks younger than his stated age, NAD HEENT: Unremarkable Neck:  7 cm JVD, no thyromegally, no bruit Lungs:  Clear with no wheezes, rales, or rhonchi. HEART:  IRegular rate rhythm, no murmurs, no rubs, no clicks Abd:  soft, positive bowel sounds, no organomegally, no rebound, no guarding Ext:  2 plus pulses, no edema, no cyanosis, no clubbing Skin:  No rashes no nodules Neuro:  CN II through XII intact, motor grossly intact  DEVICE  Normal  device function.  See PaceArt for details.   Assess/Plan:

## 2015-01-29 NOTE — Assessment & Plan Note (Signed)
His Medtronic device is working normally. Will recheck in several months. 

## 2015-01-29 NOTE — Assessment & Plan Note (Signed)
He has noise in his ear which occurs with his heart beat. This could be due to vascular disease. As he is otherwise asymptomatic, I would not do any additional testing.

## 2015-01-29 NOTE — Patient Instructions (Signed)

## 2015-03-22 ENCOUNTER — Encounter: Payer: Self-pay | Admitting: Internal Medicine

## 2015-04-30 ENCOUNTER — Ambulatory Visit (INDEPENDENT_AMBULATORY_CARE_PROVIDER_SITE_OTHER): Payer: Medicare Other | Admitting: *Deleted

## 2015-04-30 DIAGNOSIS — I4729 Other ventricular tachycardia: Secondary | ICD-10-CM

## 2015-04-30 DIAGNOSIS — I472 Ventricular tachycardia: Secondary | ICD-10-CM | POA: Diagnosis not present

## 2015-04-30 NOTE — Progress Notes (Signed)
Remote ICD transmission.   

## 2015-05-03 LAB — CUP PACEART REMOTE DEVICE CHECK
Battery Voltage: 2.77 V
Brady Statistic AP VS Percent: 0 %
Brady Statistic AS VP Percent: 17.47 %
Brady Statistic AS VS Percent: 82.53 %
Brady Statistic RV Percent Paced: 17.47 %
HIGH POWER IMPEDANCE MEASURED VALUE: 43 Ohm
HIGH POWER IMPEDANCE MEASURED VALUE: 56 Ohm
Lead Channel Impedance Value: 627 Ohm
Lead Channel Sensing Intrinsic Amplitude: 1 mV
Lead Channel Sensing Intrinsic Amplitude: 10 mV
Lead Channel Setting Pacing Amplitude: 2.5 V
Lead Channel Setting Pacing Pulse Width: 0.4 ms
MDC IDC MSMT LEADCHNL RV IMPEDANCE VALUE: 494 Ohm
MDC IDC SESS DTM: 20160712131953
MDC IDC SET LEADCHNL RV SENSING SENSITIVITY: 0.3 mV
MDC IDC SET ZONE DETECTION INTERVAL: 250 ms
MDC IDC SET ZONE DETECTION INTERVAL: 350 ms
MDC IDC SET ZONE DETECTION INTERVAL: 450 ms
MDC IDC STAT BRADY AP VP PERCENT: 0 %
MDC IDC STAT BRADY RA PERCENT PACED: 0 %
Zone Setting Detection Interval: 300 ms
Zone Setting Detection Interval: 340 ms

## 2015-05-15 ENCOUNTER — Encounter: Payer: Self-pay | Admitting: *Deleted

## 2015-05-20 ENCOUNTER — Encounter: Payer: Self-pay | Admitting: Internal Medicine

## 2015-08-01 ENCOUNTER — Telehealth: Payer: Self-pay | Admitting: Cardiology

## 2015-08-01 ENCOUNTER — Encounter: Payer: Medicare Other | Admitting: *Deleted

## 2015-08-01 NOTE — Telephone Encounter (Signed)
Pt called and stated that his home monitor got knocked off night stand beside his bed and broke. Offered to order pt a new monitor but pt refused saying that he can not use wire t or wirex. Pt agreed to appt on 08-07-15 at 3:30 PM.

## 2015-08-01 NOTE — Telephone Encounter (Signed)
LMOVM reminding pt to send remote transmission.   

## 2015-08-05 ENCOUNTER — Telehealth: Payer: Self-pay | Admitting: *Deleted

## 2015-08-05 ENCOUNTER — Encounter: Payer: Self-pay | Admitting: Cardiology

## 2015-08-05 NOTE — Telephone Encounter (Signed)
Pt calling about recent trip to ER near his home due to high BP and his heart being "out of rhythm". Pt reports medication changes were made in the ER and he would like to see Dr. Ladona Ridgel when he comes for his defib check Wednesday. I told him Dr. Ladona Ridgel will be out of the office until the 25th and we will have to call him back to schedule follow up appt with Dr. Ladona Ridgel. Pt agreeable with plan.

## 2015-08-15 ENCOUNTER — Ambulatory Visit (INDEPENDENT_AMBULATORY_CARE_PROVIDER_SITE_OTHER): Payer: Medicare Other | Admitting: Internal Medicine

## 2015-08-15 ENCOUNTER — Encounter: Payer: Self-pay | Admitting: Internal Medicine

## 2015-08-15 VITALS — BP 140/70 | HR 52 | Ht 71.0 in | Wt 199.8 lb

## 2015-08-15 DIAGNOSIS — I2510 Atherosclerotic heart disease of native coronary artery without angina pectoris: Secondary | ICD-10-CM

## 2015-08-15 DIAGNOSIS — Z95810 Presence of automatic (implantable) cardiac defibrillator: Secondary | ICD-10-CM

## 2015-08-15 DIAGNOSIS — I4729 Other ventricular tachycardia: Secondary | ICD-10-CM

## 2015-08-15 DIAGNOSIS — I472 Ventricular tachycardia, unspecified: Secondary | ICD-10-CM

## 2015-08-15 DIAGNOSIS — R42 Dizziness and giddiness: Secondary | ICD-10-CM

## 2015-08-15 DIAGNOSIS — I482 Chronic atrial fibrillation, unspecified: Secondary | ICD-10-CM

## 2015-08-15 DIAGNOSIS — Z4502 Encounter for adjustment and management of automatic implantable cardiac defibrillator: Secondary | ICD-10-CM

## 2015-08-15 HISTORY — DX: Dizziness and giddiness: R42

## 2015-08-15 LAB — CUP PACEART INCLINIC DEVICE CHECK
Battery Voltage: 2.65 V
Brady Statistic AP VP Percent: 0 %
Brady Statistic RV Percent Paced: 12.38 %
Date Time Interrogation Session: 20161027150245
HighPow Impedance: 38 Ohm
HighPow Impedance: 49 Ohm
Implantable Lead Implant Date: 20110209
Implantable Lead Location: 753860
Implantable Lead Model: 5076
Implantable Lead Model: 6947
Lead Channel Impedance Value: 551 Ohm
Lead Channel Pacing Threshold Amplitude: 0.75 V
Lead Channel Sensing Intrinsic Amplitude: 9.875 mV
Lead Channel Setting Pacing Pulse Width: 0.4 ms
Lead Channel Setting Sensing Sensitivity: 0.3 mV
MDC IDC LEAD IMPLANT DT: 20110209
MDC IDC LEAD LOCATION: 753859
MDC IDC MSMT LEADCHNL RA SENSING INTR AMPL: 1.875 mV
MDC IDC MSMT LEADCHNL RV IMPEDANCE VALUE: 475 Ohm
MDC IDC MSMT LEADCHNL RV PACING THRESHOLD PULSEWIDTH: 0.4 ms
MDC IDC SET LEADCHNL RV PACING AMPLITUDE: 2.5 V
MDC IDC STAT BRADY AP VS PERCENT: 0 %
MDC IDC STAT BRADY AS VP PERCENT: 12.38 %
MDC IDC STAT BRADY AS VS PERCENT: 87.62 %
MDC IDC STAT BRADY RA PERCENT PACED: 0 %

## 2015-08-15 NOTE — Assessment & Plan Note (Signed)
This is chronic and his ventricular rate is controlled. No medication changes.

## 2015-08-15 NOTE — Assessment & Plan Note (Signed)
The etiology is unclear. Today, we checked his vitals sitting and standing and his blood pressure actually went up with standing. No orthostasis. I suspect his dizziness is inner ear. We discussed referral to neurology or ENT but he will wait and see if his symptoms improve on its own.

## 2015-08-15 NOTE — Assessment & Plan Note (Signed)
He has had no recurrent VT. Will follow.  

## 2015-08-15 NOTE — Progress Notes (Signed)
HPI Jeremy Cobb returns today for followup. He is a very pleasant 81 year old man with a history of resuscitated ventricular fibrillation cardiac arrest. He is status post ICD implantation. He has a history of aortic insufficiency and coronary disease. He has relatively preserved left ventricular function. He has atrial fibrillation. When I saw the patient last, he had stopped his amiodarone and had reverted back to atrial fibrillation. Since then he has been in atrial fibrillation 100% of the time. He denies syncope, chest pain, or shortness of breath. No ICD shock. He c/o pain in his legs when he stands. He also c/o dizziness which is constant when he stands. Despite this he was able to do an extensive construction on a walkway leading to his garage. His digoxin was stopped and he was placed on diltiazem after presenting with a HTN urgency.  No Known Allergies   Current Outpatient Prescriptions  Medication Sig Dispense Refill  . apixaban (ELIQUIS) 5 MG TABS tablet Take 5 mg by mouth 2 (two) times daily.    . CRESTOR 20 MG tablet Take 20 mg by mouth daily before breakfast.    . diltiazem (CARDIZEM CD) 180 MG 24 hr capsule Take 180 mg by mouth daily.  0  . ergocalciferol (VITAMIN D2) 50000 UNITS capsule Take 50,000 Units by mouth once a week.    Marland Kitchen GLIPIZIDE XL 5 MG 24 hr tablet Take 5 mg by mouth daily.    Marland Kitchen HYDROcodone-acetaminophen (NORCO/VICODIN) 5-325 MG per tablet Take 0.5 tablets by mouth every 6 (six) hours as needed for moderate pain (1/2 tab as needed).     Marland Kitchen levothyroxine (LEVOTHROID) 50 MCG tablet Take 50 mcg by mouth daily.      Marland Kitchen omeprazole (PRILOSEC) 40 MG capsule Take 40 mg by mouth daily.      No current facility-administered medications for this visit.     Past Medical History  Diagnosis Date  . CAD (coronary artery disease)     nonobstructive  . Atrial fibrillation (HCC)   . 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 Diseases of tricuspid valve   . Mitral  valve insufficiency and aortic valve insufficiency   . Other and unspecified hyperlipidemia   . Esophageal reflux   . Type II or unspecified type diabetes mellitus without mention of complication, not stated as uncontrolled   . Unspecified disorder of kidney and ureter   . Unspecified hypothyroidism   . Obstructive sleep apnea (adult) (pediatric)   . Osteoarthrosis, unspecified whether generalized or localized, unspecified site   . Asbestosis(501)   . Degeneration of intervertebral disc, site unspecified     ROS:   All systems reviewed and negative except as noted in the HPI.   Past Surgical History  Procedure Laterality Date  . Right total knee arthroplasty  Nov 18, 2009     Family History  Problem Relation Age of Onset  . Diabetes Father   . Stroke Father   . Diabetes Sister   . Anuerysm Brother      Social History   Social History  . Marital Status: Married    Spouse Name: N/A  . Number of Children: N/A  . Years of Education: N/A   Occupational History  . Not on file.   Social History Main Topics  . Smoking status: Never Smoker   . Smokeless tobacco: Not on file  . Alcohol Use: Not on file  . Drug Use: Not on file  . Sexual Activity: Not on file  Other Topics Concern  . Not on file   Social History Narrative   No ETOH or tobacco abuse.     BP 140/70 mmHg  Pulse 52  Ht  (1.803 m)  Wt 199 lb 12.8 oz (90.629 kg)  BMI 27.88 kg/m2  Physical Exam:  Well appearing 81 year old man, who looks younger than his stated age, NAD HEENT: Unremarkable Neck:  7 cm JVD, no thyromegally, no bruit Lungs:  Clear with no wheezes, rales, or rhonchi. HEART:  IRegular rate rhythm, no murmurs, no rubs, no clicks Abd:  soft, positive bowel sounds, no organomegally, no rebound, no guarding Ext:  2 plus pulses, no edema, no cyanosis, no clubbing Skin:  No rashes no nodules Neuro:  CN II through XII intact, motor grossly intact  DEVICE  Normal device function.   See PaceArt for details.   Assess/Plan:

## 2015-08-15 NOTE — Assessment & Plan Note (Signed)
His medtronic device is working normally. Will recheck in several months.  

## 2015-08-15 NOTE — Patient Instructions (Addendum)
Medication Instructions:  Your physician recommends that you continue on your current medications as directed. Please refer to the Current Medication list given to you today.  Labwork: None ordered  Testing/Procedures: None ordered  Follow-Up: Remote monitoring is used to monitor your Pacemaker of ICD from home. This monitoring reduces the number of office visits required to check your device to one time per year. It allows [place] to keep an eye on the functioning of your device to ensure it is working properly. You are scheduled for a device check from home on 11/14/2015. You may send your transmission at any time that day. If you have a wireless device, the transmission will be sent automatically. After your physician reviews your transmission, you will receive a postcard with your next transmission date.  Your physician wants you to follow-up in: 1 year with Dr. Ladona Ridgel. You will receive a reminder letter in the mail two months in advance. If you don't receive a letter, please call our office to schedule the follow-up appointment.   Any Other Special Instructions Will Be Listed Below (If Applicable). Call our office if you would like a referral to neurology.  If you need a refill on your cardiac medications before your next appointment, please call your pharmacy.  Thank you for choosing [facility]!! Your physician wants you to follow-up in: 1 year with Dr. Ladona Ridgel. You will receive a reminder letter in the mail two months in advance. If you don't receive a letter, please call our office to schedule the follow-up appointment.  Remote monitoring is used to monitor your Pacemaker of ICD from home. This monitoring reduces the number of office visits required to check your device to one time per year. It allows [place] to keep an eye on the functioning of your device to ensure it is working properly. You are scheduled for a device check from home on 11/14/2015. You may send your transmission at  any time that day. If you have a wireless device, the transmission will be sent automatically. After your physician reviews your transmission, you will receive a postcard with your next transmission date.

## 2015-08-20 DIAGNOSIS — Z8674 Personal history of sudden cardiac arrest: Secondary | ICD-10-CM | POA: Insufficient documentation

## 2015-08-27 ENCOUNTER — Encounter: Payer: Self-pay | Admitting: Internal Medicine

## 2015-09-05 DIAGNOSIS — I952 Hypotension due to drugs: Secondary | ICD-10-CM | POA: Insufficient documentation

## 2015-11-14 ENCOUNTER — Telehealth: Payer: Self-pay | Admitting: Cardiology

## 2015-11-14 ENCOUNTER — Encounter: Payer: Medicare Other | Admitting: *Deleted

## 2015-11-14 NOTE — Telephone Encounter (Signed)
LMOVM reminding pt to send remote transmission.   

## 2015-11-15 ENCOUNTER — Encounter: Payer: Self-pay | Admitting: Cardiology

## 2015-12-19 ENCOUNTER — Telehealth: Payer: Self-pay | Admitting: Cardiology

## 2015-12-19 NOTE — Telephone Encounter (Signed)
Spoke w/ pt and informed him that his device has reached ERI. Informed him that a scheduler will call him to schedule an appt w/ MD / PA / NP. Pt verbalized understanding.

## 2015-12-24 DIAGNOSIS — E538 Deficiency of other specified B group vitamins: Secondary | ICD-10-CM | POA: Insufficient documentation

## 2015-12-24 DIAGNOSIS — M4800 Spinal stenosis, site unspecified: Secondary | ICD-10-CM | POA: Insufficient documentation

## 2015-12-24 DIAGNOSIS — E559 Vitamin D deficiency, unspecified: Secondary | ICD-10-CM | POA: Insufficient documentation

## 2015-12-26 ENCOUNTER — Encounter: Payer: Self-pay | Admitting: Internal Medicine

## 2015-12-26 ENCOUNTER — Ambulatory Visit (INDEPENDENT_AMBULATORY_CARE_PROVIDER_SITE_OTHER): Payer: Medicare Other | Admitting: Internal Medicine

## 2015-12-26 VITALS — BP 160/80 | HR 74 | Ht 71.0 in | Wt 213.2 lb

## 2015-12-26 DIAGNOSIS — I472 Ventricular tachycardia: Secondary | ICD-10-CM | POA: Diagnosis not present

## 2015-12-26 DIAGNOSIS — I4729 Other ventricular tachycardia: Secondary | ICD-10-CM

## 2015-12-26 DIAGNOSIS — Z4502 Encounter for adjustment and management of automatic implantable cardiac defibrillator: Secondary | ICD-10-CM | POA: Diagnosis not present

## 2015-12-26 LAB — CUP PACEART INCLINIC DEVICE CHECK
Battery Voltage: 2.62 V
Brady Statistic AP VP Percent: 0 %
Brady Statistic AP VS Percent: 0 %
Brady Statistic AS VS Percent: 96 %
HighPow Impedance: 39 Ohm
HighPow Impedance: 48 Ohm
Implantable Lead Implant Date: 20110209
Lead Channel Pacing Threshold Pulse Width: 0.4 ms
Lead Channel Setting Pacing Amplitude: 2.5 V
Lead Channel Setting Pacing Pulse Width: 0.4 ms
Lead Channel Setting Sensing Sensitivity: 0.3 mV
MDC IDC LEAD IMPLANT DT: 20110209
MDC IDC LEAD LOCATION: 753859
MDC IDC LEAD LOCATION: 753860
MDC IDC MSMT LEADCHNL RA IMPEDANCE VALUE: 551 Ohm
MDC IDC MSMT LEADCHNL RV IMPEDANCE VALUE: 437 Ohm
MDC IDC MSMT LEADCHNL RV PACING THRESHOLD AMPLITUDE: 0.75 V
MDC IDC MSMT LEADCHNL RV SENSING INTR AMPL: 10.25 mV
MDC IDC SESS DTM: 20170309165950
MDC IDC STAT BRADY AS VP PERCENT: 4 %
MDC IDC STAT BRADY RA PERCENT PACED: 0 %
MDC IDC STAT BRADY RV PERCENT PACED: 4 %

## 2015-12-26 LAB — CBC WITH DIFFERENTIAL/PLATELET
BASOS ABS: 0 10*3/uL (ref 0.0–0.1)
BASOS PCT: 0 % (ref 0–1)
Eosinophils Absolute: 0.6 10*3/uL (ref 0.0–0.7)
Eosinophils Relative: 7 % — ABNORMAL HIGH (ref 0–5)
HCT: 38.9 % — ABNORMAL LOW (ref 39.0–52.0)
HEMOGLOBIN: 12.8 g/dL — AB (ref 13.0–17.0)
Lymphocytes Relative: 27 % (ref 12–46)
Lymphs Abs: 2.4 10*3/uL (ref 0.7–4.0)
MCH: 32.2 pg (ref 26.0–34.0)
MCHC: 32.9 g/dL (ref 30.0–36.0)
MCV: 97.7 fL (ref 78.0–100.0)
MONO ABS: 0.9 10*3/uL (ref 0.1–1.0)
MPV: 11.9 fL (ref 8.6–12.4)
Monocytes Relative: 10 % (ref 3–12)
NEUTROS ABS: 5 10*3/uL (ref 1.7–7.7)
Neutrophils Relative %: 56 % (ref 43–77)
Platelets: 230 10*3/uL (ref 150–400)
RBC: 3.98 MIL/uL — AB (ref 4.22–5.81)
RDW: 13.4 % (ref 11.5–15.5)
WBC: 8.9 10*3/uL (ref 4.0–10.5)

## 2015-12-26 LAB — BASIC METABOLIC PANEL
BUN: 21 mg/dL (ref 7–25)
CALCIUM: 9.4 mg/dL (ref 8.6–10.3)
CO2: 25 mmol/L (ref 20–31)
Chloride: 100 mmol/L (ref 98–110)
Creat: 1.29 mg/dL — ABNORMAL HIGH (ref 0.70–1.11)
GLUCOSE: 64 mg/dL — AB (ref 65–99)
POTASSIUM: 4.2 mmol/L (ref 3.5–5.3)
SODIUM: 138 mmol/L (ref 135–146)

## 2015-12-26 NOTE — Progress Notes (Signed)
HPI Jeremy Cobb returns today for followup. He is a very pleasant 82 year old man with a history of resuscitated ventricular fibrillation cardiac arrest. He is status post ICD implantation. He has a history of aortic insufficiency and coronary disease. He has relatively preserved left ventricular function. He has atrial fibrillation. When I saw the patient last, he had stopped his amiodarone and had reverted back to atrial fibrillation. Since then he has been in atrial fibrillation 100% of the time. He denies syncope, chest pain, or shortness of breath. No ICD shock. He has reached ERI on his device.  Allergies  Allergen Reactions  . Ivp Dye [Iodinated Diagnostic Agents] Other (See Comments)    Sent patient into a coma     Current Outpatient Prescriptions  Medication Sig Dispense Refill  . apixaban (ELIQUIS) 5 MG TABS tablet Take 5 mg by mouth 2 (two) times daily.    . CRESTOR 20 MG tablet Take 20 mg by mouth daily before breakfast.    . diltiazem (CARDIZEM CD) 180 MG 24 hr capsule Take 180 mg by mouth daily.  0  . ergocalciferol (VITAMIN D2) 50000 UNITS capsule Take 50,000 Units by mouth once a week.    Marland Kitchen GLIPIZIDE XL 5 MG 24 hr tablet Take 5 mg by mouth daily.    Marland Kitchen HYDROcodone-acetaminophen (NORCO/VICODIN) 5-325 MG per tablet Take 0.5 tablets by mouth every 6 (six) hours as needed for moderate pain (1/2 tab as needed).     Marland Kitchen levothyroxine (SYNTHROID, LEVOTHROID) 88 MCG tablet Take 88 mcg by mouth daily.    Marland Kitchen omeprazole (PRILOSEC) 40 MG capsule Take 40 mg by mouth daily.      No current facility-administered medications for this visit.     Past Medical History  Diagnosis Date  . CAD (coronary artery disease)     nonobstructive  . Atrial fibrillation (HCC)   . 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 Diseases of tricuspid valve   . Mitral valve insufficiency and aortic valve insufficiency   . Other and unspecified hyperlipidemia   . Esophageal reflux   . Type II or  unspecified type diabetes mellitus without mention of complication, not stated as uncontrolled   . Unspecified disorder of kidney and ureter   . Unspecified hypothyroidism   . Obstructive sleep apnea (adult) (pediatric)   . Osteoarthrosis, unspecified whether generalized or localized, unspecified site   . Asbestosis(501)   . Degeneration of intervertebral disc, site unspecified     ROS:   All systems reviewed and negative except as noted in the HPI.   Past Surgical History  Procedure Laterality Date  . Right total knee arthroplasty  Nov 18, 2009     Family History  Problem Relation Age of Onset  . Diabetes Father   . Stroke Father   . Diabetes Sister   . Anuerysm Brother      Social History   Social History  . Marital Status: Married    Spouse Name: N/A  . Number of Children: N/A  . Years of Education: N/A   Occupational History  . Not on file.   Social History Main Topics  . Smoking status: Never Smoker   . Smokeless tobacco: Not on file  . Alcohol Use: Not on file  . Drug Use: Not on file  . Sexual Activity: Not on file   Other Topics Concern  . Not on file   Social History Narrative   No ETOH or tobacco abuse.  BP 160/80 mmHg  Pulse 74  Ht  (1.803 m)  Wt 213 lb 3.2 oz (96.707 kg)  BMI 29.75 kg/m2  Physical Exam:  Well appearing 82 year old man, who looks younger than his stated age, NAD HEENT: Unremarkable Neck:  7 cm JVD, no thyromegally, no bruit Lungs:  Clear with no wheezes, rales, or rhonchi. HEART:  IRegular rate rhythm, no murmurs, no rubs, no clicks Abd:  soft, positive bowel sounds, no organomegally, no rebound, no guarding Ext:  2 plus pulses, no edema, no cyanosis, no clubbing Skin:  No rashes no nodules Neuro:  CN II through XII intact, motor grossly intact  DEVICE  Normal device function.  See PaceArt for details. ERI.   Assess/Plan: 1. VF arrest - he is s/p ICD implant. He has reached ERI. Will schedule ICD  generator change out. As he is secondary prevention, will not repeat his echo as his EF was normal at his last eval 5 years ago. 2. CAD - he denies anginal symptoms. Will follow. 3. Atrial fib - he is now predominantly in atrial fib. Will follow. 4. ICD - his device is working normally but has reached ERI.  Taetum Flewellen,M.D. 2.

## 2015-12-26 NOTE — Patient Instructions (Signed)
Medication Instructions:  Your physician recommends that you continue on your current medications as directed. Please refer to the Current Medication list given to you today.   Labwork: Your physician recommends that you return for lab work today: BMP/CBC   Testing/Procedures: ICD generator change on 01/02/16 with Dr Ladona Ridgel at [facility].  Please check in at the [facility] Entrance at 9:30am   Do not eat or drink after midnight the night before your procedure.  Hold your Eliquis the pm before and the morning of your procedure.  Do not take your medications the morning of your procedure.    Follow-Up: Your physician recommends that you schedule a follow-up appointment in: 10-14 days from 01/02/16 in the device clinic for wound check and 3 months from 01/02/16 with Dr Ladona Ridgel   Any Other Special Instructions Will Be Listed Below (If Applicable).     If you need a refill on your cardiac medications before your next appointment, please call your pharmacy.

## 2016-01-02 ENCOUNTER — Ambulatory Visit (HOSPITAL_COMMUNITY)
Admission: RE | Admit: 2016-01-02 | Discharge: 2016-01-02 | Disposition: A | Discharge status: Home / Self Care | Payer: Medicare Other | Source: Ambulatory Visit | Attending: Internal Medicine | Admitting: Internal Medicine

## 2016-01-02 ENCOUNTER — Encounter (HOSPITAL_COMMUNITY)
Admission: RE | Disposition: A | Discharge status: Home / Self Care | Payer: Self-pay | Source: Ambulatory Visit | Attending: Internal Medicine

## 2016-01-02 DIAGNOSIS — Z7901 Long term (current) use of anticoagulants: Secondary | ICD-10-CM | POA: Diagnosis not present

## 2016-01-02 DIAGNOSIS — I4901 Ventricular fibrillation: Secondary | ICD-10-CM | POA: Insufficient documentation

## 2016-01-02 DIAGNOSIS — I4729 Other ventricular tachycardia: Secondary | ICD-10-CM

## 2016-01-02 DIAGNOSIS — I4891 Unspecified atrial fibrillation: Secondary | ICD-10-CM | POA: Diagnosis not present

## 2016-01-02 DIAGNOSIS — E785 Hyperlipidemia, unspecified: Secondary | ICD-10-CM | POA: Insufficient documentation

## 2016-01-02 DIAGNOSIS — I2510 Atherosclerotic heart disease of native coronary artery without angina pectoris: Secondary | ICD-10-CM | POA: Insufficient documentation

## 2016-01-02 DIAGNOSIS — G4733 Obstructive sleep apnea (adult) (pediatric): Secondary | ICD-10-CM | POA: Insufficient documentation

## 2016-01-02 DIAGNOSIS — K219 Gastro-esophageal reflux disease without esophagitis: Secondary | ICD-10-CM | POA: Diagnosis not present

## 2016-01-02 DIAGNOSIS — M1990 Unspecified osteoarthritis, unspecified site: Secondary | ICD-10-CM | POA: Insufficient documentation

## 2016-01-02 DIAGNOSIS — Z91041 Radiographic dye allergy status: Secondary | ICD-10-CM | POA: Diagnosis not present

## 2016-01-02 DIAGNOSIS — E119 Type 2 diabetes mellitus without complications: Secondary | ICD-10-CM | POA: Insufficient documentation

## 2016-01-02 DIAGNOSIS — I429 Cardiomyopathy, unspecified: Secondary | ICD-10-CM | POA: Insufficient documentation

## 2016-01-02 DIAGNOSIS — E039 Hypothyroidism, unspecified: Secondary | ICD-10-CM | POA: Insufficient documentation

## 2016-01-02 DIAGNOSIS — Z4502 Encounter for adjustment and management of automatic implantable cardiac defibrillator: Secondary | ICD-10-CM | POA: Insufficient documentation

## 2016-01-02 DIAGNOSIS — I080 Rheumatic disorders of both mitral and aortic valves: Secondary | ICD-10-CM | POA: Diagnosis not present

## 2016-01-02 DIAGNOSIS — I472 Ventricular tachycardia: Secondary | ICD-10-CM

## 2016-01-02 HISTORY — PX: EP IMPLANTABLE DEVICE: SHX172B

## 2016-01-02 LAB — SURGICAL PCR SCREEN
MRSA, PCR: NEGATIVE
STAPHYLOCOCCUS AUREUS: NEGATIVE

## 2016-01-02 LAB — GLUCOSE, CAPILLARY: GLUCOSE-CAPILLARY: 98 mg/dL (ref 65–99)

## 2016-01-02 SURGERY — ICD/BIV ICD GENERATOR CHANGEOUT
Anesthesia: LOCAL

## 2016-01-02 MED ORDER — FENTANYL CITRATE (PF) 100 MCG/2ML IJ SOLN
INTRAMUSCULAR | Status: AC
  Filled 2016-01-02: qty 2

## 2016-01-02 MED ORDER — FENTANYL CITRATE (PF) 100 MCG/2ML IJ SOLN
INTRAMUSCULAR | Status: DC | PRN
  Administered 2016-01-02: 12.5 ug via INTRAVENOUS
  Administered 2016-01-02: 25 ug via INTRAVENOUS
  Administered 2016-01-02: 12.5 ug via INTRAVENOUS

## 2016-01-02 MED ORDER — MUPIROCIN 2 % EX OINT
TOPICAL_OINTMENT | CUTANEOUS | Status: AC
  Administered 2016-01-02: 1 via NASAL
  Filled 2016-01-02: qty 22

## 2016-01-02 MED ORDER — LIDOCAINE HCL (PF) 1 % IJ SOLN
INTRAMUSCULAR | Status: AC
  Filled 2016-01-02: qty 30

## 2016-01-02 MED ORDER — CEFAZOLIN SODIUM 1-5 GM-% IV SOLN
INTRAVENOUS | Status: AC
  Filled 2016-01-02: qty 100

## 2016-01-02 MED ORDER — CHLORHEXIDINE GLUCONATE 4 % EX LIQD: 60.0000 mL | Freq: Once | CUTANEOUS | Status: DC

## 2016-01-02 MED ORDER — MIDAZOLAM HCL 5 MG/5ML IJ SOLN
INTRAMUSCULAR | Status: DC | PRN
  Administered 2016-01-02: 1 mg via INTRAVENOUS
  Administered 2016-01-02: 2 mg via INTRAVENOUS
  Administered 2016-01-02: 1 mg via INTRAVENOUS

## 2016-01-02 MED ORDER — SODIUM CHLORIDE 0.9 % IR SOLN
Status: AC
  Filled 2016-01-02: qty 2

## 2016-01-02 MED ORDER — MIDAZOLAM HCL 5 MG/5ML IJ SOLN
INTRAMUSCULAR | Status: AC
  Filled 2016-01-02: qty 5

## 2016-01-02 MED ORDER — CEFAZOLIN SODIUM-DEXTROSE 2-3 GM-% IV SOLR
2.0000 g | INTRAVENOUS | Status: AC
  Administered 2016-01-02: 2 g via INTRAVENOUS

## 2016-01-02 MED ORDER — LIDOCAINE HCL (PF) 1 % IJ SOLN
INTRAMUSCULAR | Status: DC | PRN
  Administered 2016-01-02: 31 mL

## 2016-01-02 MED ORDER — SODIUM CHLORIDE 0.9 % IR SOLN
80.0000 mg | Status: AC
  Administered 2016-01-02: 80 mg

## 2016-01-02 MED ORDER — ACETAMINOPHEN 325 MG PO TABS: 325.0000 mg | ORAL_TABLET | ORAL | Status: DC | PRN

## 2016-01-02 MED ORDER — SODIUM CHLORIDE 0.9 % IV SOLN
INTRAVENOUS | Status: DC
  Administered 2016-01-02: 08:00:00 via INTRAVENOUS

## 2016-01-02 MED ORDER — MUPIROCIN 2 % EX OINT
TOPICAL_OINTMENT | Freq: Once | CUTANEOUS | Status: AC
  Administered 2016-01-02: 1 via NASAL

## 2016-01-02 MED ORDER — ONDANSETRON HCL 4 MG/2ML IJ SOLN: 4.0000 mg | Freq: Four times a day (QID) | INTRAMUSCULAR | Status: DC | PRN

## 2016-01-02 SURGICAL SUPPLY — 5 items
CABLE SURGICAL S-101-97-12 (CABLE) ×2 IMPLANT
ICD VISIA AF VR DVAB1D1 (ICD Generator) ×1 IMPLANT
PAD DEFIB LIFELINK (PAD) ×2 IMPLANT
TRAY PACEMAKER INSERTION (PACKS) ×2 IMPLANT
VISIA AF VR DVAB1D1 (ICD Generator) ×2 IMPLANT

## 2016-01-02 NOTE — Interval H&P Note (Signed)
History and Physical Interval Note:  01/02/2016 3:24 PM  Jeremy Cobb  has presented today for surgery, with the diagnosis of ERI  The various methods of treatment have been discussed with the patient and family. After consideration of risks, benefits and other options for treatment, the patient has consented to  Procedure(s): ICD/BIV ICD Generator Changeout (N/A) as a surgical intervention .  The patient's history has been reviewed, patient examined, no change in status, stable for surgery.  I have reviewed the patient's chart and labs.  Questions were answered to the patient's satisfaction.     Lewayne Bunting

## 2016-01-02 NOTE — H&P (Signed)
  ICD Criteria  Current LVEF:55%. Within 12 months prior to implant: No   Heart failure history: Yes, Class I  Cardiomyopathy history: No.  Atrial Fibrillation/Atrial Flutter: Yes, Permanent.  Ventricular tachycardia history: no  Cardiac arrest history: Yes, Ventricular Fibrillation.  History of syndromes with risk of sudden death: No.  Previous ICD: Yes, Reason for ICD:  Secondary prevention.  Current ICD indication: Secondary  PPM indication: No.   Class I or II Bradycardia indication present: No  Beta Blocker therapy for 3 or more months: No, medical reason.  Ace Inhibitor/ARB therapy for 3 or more months: No, medical reason.

## 2016-01-02 NOTE — H&P (View-Only) (Signed)
HPI Jeremy Cobb returns today for followup. He is a very pleasant 82 year old man with a history of resuscitated ventricular fibrillation cardiac arrest. He is status post ICD implantation. He has a history of aortic insufficiency and coronary disease. He has relatively preserved left ventricular function. He has atrial fibrillation. When I saw the patient last, he had stopped his amiodarone and had reverted back to atrial fibrillation. Since then he has been in atrial fibrillation 100% of the time. He denies syncope, chest pain, or shortness of breath. No ICD shock. He has reached ERI on his device.  Allergies  Allergen Reactions  . Ivp Dye [Iodinated Diagnostic Agents] Other (See Comments)    Sent patient into a coma     Current Outpatient Prescriptions  Medication Sig Dispense Refill  . apixaban (ELIQUIS) 5 MG TABS tablet Take 5 mg by mouth 2 (two) times daily.    . CRESTOR 20 MG tablet Take 20 mg by mouth daily before breakfast.    . diltiazem (CARDIZEM CD) 180 MG 24 hr capsule Take 180 mg by mouth daily.  0  . ergocalciferol (VITAMIN D2) 50000 UNITS capsule Take 50,000 Units by mouth once a week.    Marland Kitchen GLIPIZIDE XL 5 MG 24 hr tablet Take 5 mg by mouth daily.    Marland Kitchen HYDROcodone-acetaminophen (NORCO/VICODIN) 5-325 MG per tablet Take 0.5 tablets by mouth every 6 (six) hours as needed for moderate pain (1/2 tab as needed).     Marland Kitchen levothyroxine (SYNTHROID, LEVOTHROID) 88 MCG tablet Take 88 mcg by mouth daily.    Marland Kitchen omeprazole (PRILOSEC) 40 MG capsule Take 40 mg by mouth daily.      No current facility-administered medications for this visit.     Past Medical History  Diagnosis Date  . CAD (coronary artery disease)     nonobstructive  . Atrial fibrillation (HCC)   . 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 Diseases of tricuspid valve   . Mitral valve insufficiency and aortic valve insufficiency   . Other and unspecified hyperlipidemia   . Esophageal reflux   . Type II or  unspecified type diabetes mellitus without mention of complication, not stated as uncontrolled   . Unspecified disorder of kidney and ureter   . Unspecified hypothyroidism   . Obstructive sleep apnea (adult) (pediatric)   . Osteoarthrosis, unspecified whether generalized or localized, unspecified site   . Asbestosis(501)   . Degeneration of intervertebral disc, site unspecified     ROS:   All systems reviewed and negative except as noted in the HPI.   Past Surgical History  Procedure Laterality Date  . Right total knee arthroplasty  Nov 18, 2009     Family History  Problem Relation Age of Onset  . Diabetes Father   . Stroke Father   . Diabetes Sister   . Anuerysm Brother      Social History   Social History  . Marital Status: Married    Spouse Name: N/A  . Number of Children: N/A  . Years of Education: N/A   Occupational History  . Not on file.   Social History Main Topics  . Smoking status: Never Smoker   . Smokeless tobacco: Not on file  . Alcohol Use: Not on file  . Drug Use: Not on file  . Sexual Activity: Not on file   Other Topics Concern  . Not on file   Social History Narrative   No ETOH or tobacco abuse.  BP 160/80 mmHg  Pulse 74  Ht 5' 11" (1.803 m)  Wt 213 lb 3.2 oz (96.707 kg)  BMI 29.75 kg/m2  Physical Exam:  Well appearing 82-year-old man, who looks younger than his stated age, NAD HEENT: Unremarkable Neck:  7 cm JVD, no thyromegally, no bruit Lungs:  Clear with no wheezes, rales, or rhonchi. HEART:  IRegular rate rhythm, no murmurs, no rubs, no clicks Abd:  soft, positive bowel sounds, no organomegally, no rebound, no guarding Ext:  2 plus pulses, no edema, no cyanosis, no clubbing Skin:  No rashes no nodules Neuro:  CN II through XII intact, motor grossly intact  DEVICE  Normal device function.  See PaceArt for details. ERI.   Assess/Plan: 1. VF arrest - he is s/p ICD implant. He has reached ERI. Will schedule ICD  generator change out. As he is secondary prevention, will not repeat his echo as his EF was normal at his last eval 5 years ago. 2. CAD - he denies anginal symptoms. Will follow. 3. Atrial fib - he is now predominantly in atrial fib. Will follow. 4. ICD - his device is working normally but has reached ERI.  Jeremy Cobb,M.D. 2.  

## 2016-01-02 NOTE — Discharge Instructions (Signed)
Pacemaker Battery Change, Care After Refer to this sheet in the next few weeks. These instructions provide you with information on caring for yourself after your procedure. Your health care provider may also give you more specific instructions. Your treatment has been planned according to current medical practices, but problems sometimes occur. Call your health care provider if you have any problems or questions after your procedure. WHAT TO EXPECT AFTER THE PROCEDURE After your procedure, it is typical to have the following sensations:  Soreness at the pacemaker site. HOME CARE INSTRUCTIONS   Keep the incision clean and dry.  Unless advised otherwise,  For the first week after the replacement, avoid stretching motions that pull at the incision site, and avoid heavy exercise with the arm that is on the same side as the incision.  Take medicines only as directed by your health care provider.  Keep all follow-up visits as directed by your health care provider. SEEK MEDICAL CARE IF:   You have pain at the incision site that is not relieved by over-the-counter or prescription medicine.  There is drainage or pus from the incision site.  There is swelling larger than a lime at the incision site.  You develop red streaking that extends above or below the incision site.  You feel brief, intermittent palpitations, light-headedness, or any symptoms that you feel might be related to your heart. SEEK IMMEDIATE MEDICAL CARE IF:   You experience chest pain that is different than the pain at the pacemaker site.  You experience shortness of breath.  You have palpitations or irregular heartbeat.  You have light-headedness that does not go away quickly.  You faint.  You have pain that gets worse and is not relieved by medicine.   This information is not intended to replace advice given to you by your health care provider. Make sure you discuss any questions you have with your health care  provider.   Document Released: 07/26/2013 Document Revised: 10/26/2014 Document Reviewed: 07/26/2013 Elsevier Interactive Patient Education [employer]

## 2016-01-03 ENCOUNTER — Encounter (HOSPITAL_COMMUNITY): Payer: Self-pay | Admitting: Internal Medicine

## 2016-01-06 MED FILL — Sodium Chloride Irrigation Soln 0.9%: Qty: 500 | Status: AC

## 2016-01-06 MED FILL — Gentamicin Sulfate Inj 40 MG/ML: INTRAMUSCULAR | Qty: 2 | Status: AC

## 2016-01-15 ENCOUNTER — Telehealth: Payer: Self-pay | Admitting: Cardiology

## 2016-01-15 NOTE — Telephone Encounter (Signed)
Pt called into office and stated that he has not hooked up his new home monitor yet b/c he doesn't have cellular signal where he leaves. He has called [employer] and they ordred him an adapter so he can hook his monitor up w/ a land line phone. Instructed pt that when he gets the new adapter to call us and we will help him set up his monitor. Pt verbalized understanding.

## 2016-01-16 ENCOUNTER — Ambulatory Visit (INDEPENDENT_AMBULATORY_CARE_PROVIDER_SITE_OTHER): Payer: Medicare Other | Admitting: *Deleted

## 2016-01-16 ENCOUNTER — Encounter: Payer: Self-pay | Admitting: Internal Medicine

## 2016-01-16 DIAGNOSIS — Z95810 Presence of automatic (implantable) cardiac defibrillator: Secondary | ICD-10-CM

## 2016-01-16 LAB — CUP PACEART INCLINIC DEVICE CHECK
Battery Remaining Longevity: 137 mo
HIGH POWER IMPEDANCE MEASURED VALUE: 49 Ohm
HighPow Impedance: 36 Ohm
Implantable Lead Implant Date: 20110209
Implantable Lead Implant Date: 20110209
Implantable Lead Location: 753859
Lead Channel Impedance Value: 342 Ohm
Lead Channel Pacing Threshold Pulse Width: 0.4 ms
Lead Channel Setting Pacing Amplitude: 2.5 V
Lead Channel Setting Pacing Pulse Width: 0.4 ms
MDC IDC LEAD LOCATION: 753860
MDC IDC MSMT BATTERY VOLTAGE: 3.16 V
MDC IDC MSMT LEADCHNL RV IMPEDANCE VALUE: 437 Ohm
MDC IDC MSMT LEADCHNL RV PACING THRESHOLD AMPLITUDE: 0.75 V
MDC IDC MSMT LEADCHNL RV SENSING INTR AMPL: 8 mV
MDC IDC MSMT LEADCHNL RV SENSING INTR AMPL: 9.375 mV
MDC IDC SESS DTM: 20170330120309
MDC IDC SET LEADCHNL RV SENSING SENSITIVITY: 0.3 mV
MDC IDC STAT BRADY RV PERCENT PACED: 8.26 %

## 2016-01-16 NOTE — Progress Notes (Signed)
Wound check appointment. Steri-strips removed. Wound without redness or edema. Incision edges approximated, wound well healed. Normal device function. Threshold, sensing, and impedances consistent with implant measurements. Histogram distribution appropriate for patient and level of activity. No ventricular arrhythmias noted. Patient educated about wound care, arm mobility, lifting restrictions, shock plan. ROV in 3 months with GT

## 2016-04-09 ENCOUNTER — Encounter: Payer: Self-pay | Admitting: Internal Medicine

## 2016-04-09 ENCOUNTER — Ambulatory Visit (INDEPENDENT_AMBULATORY_CARE_PROVIDER_SITE_OTHER): Payer: Medicare Other | Admitting: Internal Medicine

## 2016-04-09 VITALS — BP 132/68 | HR 48 | Ht 71.0 in | Wt 214.0 lb

## 2016-04-09 DIAGNOSIS — I4901 Ventricular fibrillation: Secondary | ICD-10-CM | POA: Diagnosis not present

## 2016-04-09 LAB — CUP PACEART INCLINIC DEVICE CHECK
Brady Statistic RV Percent Paced: 9.36 %
Date Time Interrogation Session: 20170622122107
HIGH POWER IMPEDANCE MEASURED VALUE: 39 Ohm
HIGH POWER IMPEDANCE MEASURED VALUE: 52 Ohm
Implantable Lead Implant Date: 20110209
Implantable Lead Model: 6947
Lead Channel Impedance Value: 437 Ohm
Lead Channel Pacing Threshold Amplitude: 0.75 V
Lead Channel Setting Sensing Sensitivity: 0.3 mV
MDC IDC LEAD IMPLANT DT: 20110209
MDC IDC LEAD LOCATION: 753859
MDC IDC LEAD LOCATION: 753860
MDC IDC MSMT BATTERY REMAINING LONGEVITY: 136 mo
MDC IDC MSMT BATTERY VOLTAGE: 3.15 V
MDC IDC MSMT LEADCHNL RV IMPEDANCE VALUE: 342 Ohm
MDC IDC MSMT LEADCHNL RV PACING THRESHOLD PULSEWIDTH: 0.4 ms
MDC IDC MSMT LEADCHNL RV SENSING INTR AMPL: 9.5 mV
MDC IDC SET LEADCHNL RV PACING AMPLITUDE: 2.5 V
MDC IDC SET LEADCHNL RV PACING PULSEWIDTH: 0.4 ms

## 2016-04-09 NOTE — Patient Instructions (Addendum)
Medication Instructions:  Your physician recommends that you continue on your current medications as directed. Please refer to the Current Medication list given to you today.  Labwork: None ordered  Testing/Procedures: None ordered  Follow-Up: Remote monitoring is used to monitor your Pacemaker of ICD from home. This monitoring reduces the number of office visits required to check your device to one time per year. It allows [place] to keep an eye on the functioning of your device to ensure it is working properly. You are scheduled for a device check from home on 07/09/2016. You may send your transmission at any time that day. If you have a wireless device, the transmission will be sent automatically. After your physician reviews your transmission, you will receive a postcard with your next transmission date.  Your physician wants you to follow-up in: 9 months with Dr. Ladona Ridgel. You will receive a reminder letter in the mail two months in advance. If you don't receive a letter, please call our office to schedule the follow-up appointment.   Any Other Special Instructions Will Be Listed Below (If Applicable).  If you need a refill on your cardiac medications before your next appointment, please call your pharmacy.  Thank you for choosing [facility]!!

## 2016-04-09 NOTE — Progress Notes (Signed)
HPI [name] returns today for followup. He is a very pleasant 82 year old man with a history of resuscitated ventricular fibrillation cardiac arrest. He is status post ICD implantation. He has a history of aortic insufficiency and coronary disease. He has relatively preserved left ventricular function. He has atrial fibrillation. When I saw the patient last, he had stopped his amiodarone and had reverted back to atrial fibrillation. Since then he has been in atrial fibrillation 100% of the time. He denies syncope, chest pain, or shortness of breath. No ICD shock. He has only been taking his Eliquis once daily, despite his prescription which reads twice daily. He has had no bleeding. Allergies  Allergen Reactions  . Ivp Dye [Iodinated Diagnostic Agents] Other (See Comments)    Sent patient into a coma     Current Outpatient Prescriptions  Medication Sig Dispense Refill  . apixaban (ELIQUIS) 5 MG TABS tablet Take 5 mg by mouth 2 (two) times daily.    . CRESTOR 20 MG tablet Take 20 mg by mouth daily before breakfast.    . diltiazem (CARDIZEM CD) 180 MG 24 hr capsule Take 180 mg by mouth daily.  0  . ergocalciferol (VITAMIN D2) 50000 UNITS capsule Take 50,000 Units by mouth once a week. FRIDAY    . GLIPIZIDE XL 5 MG 24 hr tablet Take 5 mg by mouth daily.    Marland Kitchen HYDROcodone-acetaminophen (NORCO/VICODIN) 5-325 MG per tablet Take 0.5 tablets by mouth every 6 (six) hours as needed for moderate pain (1/2 tab as needed).     Marland Kitchen levothyroxine (SYNTHROID, LEVOTHROID) 88 MCG tablet Take 88 mcg by mouth daily.    Marland Kitchen omeprazole (PRILOSEC) 40 MG capsule Take 40 mg by mouth daily.      No current facility-administered medications for this visit.     Past Medical History  Diagnosis Date  . CAD (coronary artery disease)     nonobstructive  . Atrial fibrillation (HCC)   . 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 Diseases of tricuspid valve   . Mitral valve insufficiency and aortic valve  insufficiency   . Other and unspecified hyperlipidemia   . Esophageal reflux   . Type II or unspecified type diabetes mellitus without mention of complication, not stated as uncontrolled   . Unspecified disorder of kidney and ureter   . Unspecified hypothyroidism   . Obstructive sleep apnea (adult) (pediatric)   . Osteoarthrosis, unspecified whether generalized or localized, unspecified site   . Asbestosis(501)   . Degeneration of intervertebral disc, site unspecified     ROS:   All systems reviewed and negative except as noted in the HPI.   Past Surgical History  Procedure Laterality Date  . Right total knee arthroplasty  Nov 18, 2009  . Ep implantable device N/A 01/02/2016    Procedure: ICD/BIV ICD Generator Changeout;  Surgeon: Marinus Maw, MD;  Location: [facility] INVASIVE CV LAB;  Service: Cardiovascular;  Laterality: N/A;     Family History  Problem Relation Age of Onset  . Diabetes Father   . Stroke Father   . Diabetes Sister   . Anuerysm Brother      Social History   Social History  . Marital Status: Married    Spouse Name: N/A  . Number of Children: N/A  . Years of Education: N/A   Occupational History  . Not on file.   Social History Main Topics  . Smoking status: Never Smoker   . Smokeless tobacco: Not on  file  . Alcohol Use: Not on file  . Drug Use: Not on file  . Sexual Activity: Not on file   Other Topics Concern  . Not on file   Social History Narrative   No ETOH or tobacco abuse.     BP 132/68 mmHg  Pulse 48  Ht 5\' 11"  (1.803 m)  Wt 214 lb (97.07 kg)  BMI 29.86 kg/m2  Physical Exam:  Well appearing 82 year old man, who looks younger than his stated age, NAD HEENT: Unremarkable Neck:  7 cm JVD, no thyromegally, no bruit Lungs:  Clear with no wheezes, rales, or rhonchi. HEART:  IRegular rate rhythm, no murmurs, no rubs, no clicks Abd:  soft, positive bowel sounds, no organomegally, no rebound, no guarding Ext:  2 plus pulses, no  edema, no cyanosis, no clubbing Skin:  No rashes no nodules Neuro:  CN II through XII intact, motor grossly intact  DEVICE  Normal device function.  See PaceArt for details.   Assess/Plan: 1. VF arrest - he has had no additional ICD therapies. 2. CAD - he denies anginal symptoms. Will follow. 3. Atrial fib - he is now in atrial fib. His ventricular rate is slow but he is asymptomatic. I have asked him to take his Eliquis as prescribed, twice daily. 4. ICD - his device is working normally. Will recheck in several months  Jeremy Cobb.D.

## 2016-05-25 ENCOUNTER — Telehealth: Payer: Self-pay | Admitting: Internal Medicine

## 2016-05-25 NOTE — Telephone Encounter (Signed)
New message     1. Has your device fired? No   2. Is you device beeping? No   3. Are you experiencing draining or swelling at device site? No   4. Are you calling to see if we received your device transmission? No   5. Have you passed out? No    Patient having surgery on  8.9.2017 office faxing over recommendation to device clinic for review.

## 2016-05-27 NOTE — Telephone Encounter (Signed)
Device surgical clearance completed and faxed to [facility].

## 2016-07-09 ENCOUNTER — Telehealth: Payer: Self-pay | Admitting: Cardiology

## 2016-07-09 ENCOUNTER — Encounter: Payer: Medicare Other | Admitting: *Deleted

## 2016-07-09 NOTE — Telephone Encounter (Signed)
Spoke with pt and reminded pt of remote transmission that is due today. Pt verbalized understanding.   

## 2016-07-23 ENCOUNTER — Ambulatory Visit (INDEPENDENT_AMBULATORY_CARE_PROVIDER_SITE_OTHER): Payer: Medicare Other | Admitting: *Deleted

## 2016-07-23 DIAGNOSIS — I482 Chronic atrial fibrillation, unspecified: Secondary | ICD-10-CM

## 2016-07-23 DIAGNOSIS — I4729 Other ventricular tachycardia: Secondary | ICD-10-CM

## 2016-07-23 DIAGNOSIS — I472 Ventricular tachycardia: Secondary | ICD-10-CM | POA: Diagnosis not present

## 2016-07-24 LAB — CUP PACEART INCLINIC DEVICE CHECK
Battery Remaining Longevity: 134 mo
Date Time Interrogation Session: 20171005175406
HighPow Impedance: 42 Ohm
HighPow Impedance: 55 Ohm
Lead Channel Impedance Value: 380 Ohm
Lead Channel Impedance Value: 456 Ohm
Lead Channel Pacing Threshold Pulse Width: 0.4 ms
Lead Channel Sensing Intrinsic Amplitude: 10.5 mV
Lead Channel Setting Pacing Amplitude: 2.5 V
Lead Channel Setting Pacing Pulse Width: 0.4 ms
MDC IDC LEAD IMPLANT DT: 20110209
MDC IDC LEAD LOCATION: 753860
MDC IDC MSMT BATTERY VOLTAGE: 3.06 V
MDC IDC MSMT LEADCHNL RV PACING THRESHOLD AMPLITUDE: 0.75 V
MDC IDC MSMT LEADCHNL RV SENSING INTR AMPL: 9.25 mV
MDC IDC SET LEADCHNL RV SENSING SENSITIVITY: 0.3 mV
MDC IDC STAT BRADY RV PERCENT PACED: 9.78 %

## 2016-07-24 NOTE — Progress Notes (Signed)
ICD check in clinic. Normal device function. Threshold and sensing consistent with previous device measurements. Impedance trends stable over time. No evidence of any ventricular arrhythmias. Stable thoracic impedance. Histogram distribution appropriate for patient and level of activity. No changes made this session. Device programmed at appropriate safety margins. Device programmed to optimize intrinsic conduction. Estimated longevity 11.1 years. Pt will follow up with the Device clinic in 3 months. Patient education completed including shock plan. Alert tones demonstrated for patient.

## 2016-10-22 ENCOUNTER — Ambulatory Visit (INDEPENDENT_AMBULATORY_CARE_PROVIDER_SITE_OTHER): Payer: Medicare Other | Admitting: *Deleted

## 2016-10-22 DIAGNOSIS — I4729 Other ventricular tachycardia: Secondary | ICD-10-CM

## 2016-10-22 DIAGNOSIS — I472 Ventricular tachycardia: Secondary | ICD-10-CM | POA: Diagnosis not present

## 2016-10-22 DIAGNOSIS — Z95810 Presence of automatic (implantable) cardiac defibrillator: Secondary | ICD-10-CM | POA: Diagnosis not present

## 2016-10-22 LAB — CUP PACEART INCLINIC DEVICE CHECK
Battery Remaining Longevity: 133 mo
Battery Voltage: 3.03 V
Date Time Interrogation Session: 20180104150132
HIGH POWER IMPEDANCE MEASURED VALUE: 48 Ohm
HighPow Impedance: 35 Ohm
Implantable Lead Implant Date: 20110209
Implantable Pulse Generator Implant Date: 20170316
Lead Channel Impedance Value: 342 Ohm
Lead Channel Pacing Threshold Pulse Width: 0.4 ms
Lead Channel Sensing Intrinsic Amplitude: 9.125 mV
Lead Channel Setting Pacing Amplitude: 2.5 V
Lead Channel Setting Pacing Pulse Width: 0.4 ms
MDC IDC LEAD LOCATION: 753860
MDC IDC MSMT LEADCHNL RV IMPEDANCE VALUE: 399 Ohm
MDC IDC MSMT LEADCHNL RV PACING THRESHOLD AMPLITUDE: 0.75 V
MDC IDC SET LEADCHNL RV SENSING SENSITIVITY: 0.3 mV
MDC IDC STAT BRADY RV PERCENT PACED: 13.92 %

## 2016-10-22 NOTE — Progress Notes (Signed)
ICD check in clinic. Normal device function. Thresholds and sensing consistent with previous device measurements. Impedance trends stable over time. No evidence of any ventricular arrhythmias. No AF episodes. Histogram distribution appropriate for patient and level of activity. No changes made this session. Device programmed at appropriate safety margins. Device programmed to optimize intrinsic conduction. Estimated longevity 11.0 years. Pt enrolled in remote follow-up, agrees to try landline adapter again. Patient education completed including shock plan. Alert tones demonstrated for patient. ROV with GT on 01/13/17.

## 2016-10-27 ENCOUNTER — Telehealth: Payer: Self-pay | Admitting: *Deleted

## 2016-10-27 NOTE — Telephone Encounter (Signed)
[facility] requesting call back.  Gave Device Clinic phone number for return call.  Will attempt to assist patient with setting up home monitor with landline adapter.

## 2016-10-28 NOTE — Telephone Encounter (Signed)
Spoke with patient, receiving error code 8239 on monitor.  Advised this means the monitor needs a software update.  Instructed patient to unplug monitor and plug it back in.  Will call back later today.  Spoke with [facility] tech services to ensure that no additional steps needed.  Update can take up to 1hr.

## 2016-10-28 NOTE — Telephone Encounter (Signed)
Patient returned call.  Walked patient through remote transmission process verbally, but unable to stay on the line due to monitor utilizing landline phone.  Advised I will call back in 15 min or so.  Patient is agreeable to this plan and is appreciative of call.

## 2016-10-28 NOTE — Telephone Encounter (Signed)
Spoke with patient, advised that update should be complete by now.  Requested that patient try sending a manual transmission again.  He verbalizes understanding and denies additional questions or concerns at this time.

## 2016-10-30 NOTE — Telephone Encounter (Signed)
Spoke w/ pt and informed him to send a transmission w/ his home monitor. Pt informed me that he had tried since attempting a software update on his monitor but he got an error code. Informed pt to go send a remote transmission and to write down the error code that he got. Pt verbalized understanding. Pt called back and gave me the error code he was receiving. The error code he received indicated that the software updated had not taking place. Instructed pt to bring his monitor with him to his March 2018 appt so we can assist him in updating his monitor. Pt verbalized understanding.

## 2016-10-30 NOTE — Telephone Encounter (Signed)
[facility] requesting call back to the Device Clinic. 

## 2017-01-13 ENCOUNTER — Encounter: Payer: Self-pay | Admitting: Internal Medicine

## 2017-01-13 ENCOUNTER — Encounter (INDEPENDENT_AMBULATORY_CARE_PROVIDER_SITE_OTHER): Payer: Self-pay

## 2017-01-13 ENCOUNTER — Ambulatory Visit (INDEPENDENT_AMBULATORY_CARE_PROVIDER_SITE_OTHER): Payer: Medicare Other | Admitting: Internal Medicine

## 2017-01-13 VITALS — BP 146/82 | HR 62 | Ht 71.0 in | Wt 222.2 lb

## 2017-01-13 DIAGNOSIS — Z95810 Presence of automatic (implantable) cardiac defibrillator: Secondary | ICD-10-CM

## 2017-01-13 DIAGNOSIS — I472 Ventricular tachycardia: Secondary | ICD-10-CM

## 2017-01-13 DIAGNOSIS — I4729 Other ventricular tachycardia: Secondary | ICD-10-CM

## 2017-01-13 LAB — CUP PACEART INCLINIC DEVICE CHECK
Battery Voltage: 3.04 V
Brady Statistic RV Percent Paced: 12.42 %
Date Time Interrogation Session: 20180328145716
HIGH POWER IMPEDANCE MEASURED VALUE: 38 Ohm
HighPow Impedance: 51 Ohm
Implantable Lead Implant Date: 20110209
Lead Channel Impedance Value: 342 Ohm
Lead Channel Impedance Value: 437 Ohm
Lead Channel Sensing Intrinsic Amplitude: 10.375 mV
Lead Channel Sensing Intrinsic Amplitude: 9.125 mV
MDC IDC LEAD LOCATION: 753860
MDC IDC MSMT BATTERY REMAINING LONGEVITY: 132 mo
MDC IDC MSMT LEADCHNL RV PACING THRESHOLD AMPLITUDE: 0.75 V
MDC IDC MSMT LEADCHNL RV PACING THRESHOLD PULSEWIDTH: 0.4 ms
MDC IDC PG IMPLANT DT: 20170316
MDC IDC SET LEADCHNL RV PACING AMPLITUDE: 2.5 V
MDC IDC SET LEADCHNL RV PACING PULSEWIDTH: 0.4 ms
MDC IDC SET LEADCHNL RV SENSING SENSITIVITY: 0.3 mV

## 2017-01-13 MED ORDER — FUROSEMIDE 20 MG PO TABS: 20.0000 mg | ORAL_TABLET | Freq: Every day | ORAL | 11 refills | Status: DC

## 2017-01-13 NOTE — Progress Notes (Signed)
HPI Mr. Quast returns today for followup. He is a very pleasant 83 year old man with a history of resuscitated ventricular fibrillation cardiac arrest. He is status post ICD implantation. He has a history of aortic insufficiency and coronary disease. He has relatively preserved left ventricular function. He has atrial fibrillation. When I saw the patient last, he had stopped his amiodarone and had reverted back to atrial fibrillation. Since then he has been in atrial fibrillation 100% of the time. He denies syncope, chest pain, but has had some shortness of breath. No ICD shock. He has had no bleeding on Eliquis. Allergies  Allergen Reactions  . Ivp Dye [Iodinated Diagnostic Agents] Other (See Comments)    Sent patient into a coma  . Tuberculin Tests Swelling     Current Outpatient Prescriptions  Medication Sig Dispense Refill  . acetaminophen (TYLENOL) 500 MG tablet Take 500 mg by mouth at bedtime.    Marland Kitchen apixaban (ELIQUIS) 5 MG TABS tablet Take 5 mg by mouth 2 (two) times daily.    . CRESTOR 20 MG tablet Take 20 mg by mouth daily before breakfast.    . diltiazem (CARDIZEM CD) 180 MG 24 hr capsule Take 180 mg by mouth daily.  0  . ergocalciferol (VITAMIN D2) 50000 UNITS capsule Take 50,000 Units by mouth once a week. FRIDAY    . GLIPIZIDE XL 5 MG 24 hr tablet Take 5 mg by mouth daily.    Marland Kitchen HYDROcodone-acetaminophen (NORCO/VICODIN) 5-325 MG per tablet Take 0.5 tablets by mouth every 6 (six) hours as needed for moderate pain (1/2 tab as needed).     Marland Kitchen levothyroxine (SYNTHROID, LEVOTHROID) 88 MCG tablet Take 88 mcg by mouth daily.    Marland Kitchen omeprazole (PRILOSEC) 40 MG capsule Take 40 mg by mouth daily.      No current facility-administered medications for this visit.      Past Medical History:  Diagnosis Date  . Asbestosis(501)   . Atrial fibrillation (HCC)   . CAD (coronary artery disease)    nonobstructive  . Degeneration of intervertebral disc, site unspecified   . Diseases of tricuspid  valve   . Esophageal reflux   . Mitral valve insufficiency and aortic valve insufficiency   . Obstructive sleep apnea (adult) (pediatric)   . Osteoarthrosis, unspecified whether generalized or localized, unspecified site   . Other and unspecified hyperlipidemia   . Other primary cardiomyopathies    presumed noischemic  . Type II or unspecified type diabetes mellitus without mention of complication, not stated as uncontrolled   . Unspecified disorder of kidney and ureter   . Unspecified hypothyroidism     ROS:   All systems reviewed and negative except as noted in the HPI.   Past Surgical History:  Procedure Laterality Date  . EP IMPLANTABLE DEVICE N/A 01/02/2016   Procedure: ICD/BIV ICD Generator Changeout;  Surgeon: Marinus Maw, MD;  Location: [facility] INVASIVE CV LAB;  Service: Cardiovascular;  Laterality: N/A;  . right total knee arthroplasty  Nov 18, 2009     Family History  Problem Relation Age of Onset  . Diabetes Father   . Stroke Father   . Diabetes Sister   . Anuerysm Brother      Social History   Social History  . Marital status: Married    Spouse name: N/A  . Number of children: N/A  . Years of education: N/A   Occupational History  . Not on file.   Social History Main Topics  . Smoking status: Never  Smoker  . Smokeless tobacco: Never Used  . Alcohol use Not on file  . Drug use: Unknown  . Sexual activity: Not on file   Other Topics Concern  . Not on file   Social History Narrative   No ETOH or tobacco abuse.     BP (!) 146/82   Pulse 62   Ht 5\' 11"  (1.803 m)   Wt 222 lb 3.2 oz (100.8 kg)   SpO2 94%   BMI 30.99 kg/m   Physical Exam:  Well appearing 83 year old man, who looks younger than his stated age, NAD HEENT: Unremarkable Neck:  7 cm JVD, no thyromegally, no bruit Lungs:  Clear with no wheezes, rales, or rhonchi. HEART:  IRegular rate rhythm, no murmurs, no rubs, no clicks Abd:  soft, positive bowel sounds, no organomegally, no  rebound, no guarding Ext:  2 plus pulses, no edema, no cyanosis, no clubbing Skin:  No rashes no nodules Neuro:  CN II through XII intact, motor grossly intact  DEVICE  Normal device function.  See PaceArt for details.   Assess/Plan: 1. VF arrest - he has had no additional ICD therapies. 2. CAD/CHF - he denies anginal symptoms. We will restart lasix 20 mg daily and allow an extra as needed for sob and fluid overload 3. Atrial fib - he is now in atrial fib. His ventricular rate is slow but he is asymptomatic. I have asked him to take his Eliquis as prescribed, twice daily. 4. ICD - his device is working normally. Will recheck in several months  Leonia Reeves.D.

## 2017-01-13 NOTE — Patient Instructions (Addendum)
Your physician has recommended you make the following change in your medication:  1.) start furosemide (Lasix) 20 mg --one tablet every morning.  You can take one additional tablet daily if needed for shortness of breath or swelling.  Remote monitoring is used to monitor your Pacemaker of ICD from home. This monitoring reduces the number of office visits required to check your device to one time per year. It allows [place] to keep an eye on the functioning of your device to ensure it is working properly. You are scheduled for a device check from home on 04/14/17. You may send your transmission at any time that day. If you have a wireless device, the transmission will be sent automatically. After your physician reviews your transmission, you will receive a postcard with your next transmission date.  Your physician wants you to follow-up in: 12 mo with Dr. Ladona Ridgel.  You will receive a reminder letter in the mail two months in advance. If you don't receive a letter, please call our office to schedule the follow-up appointment.

## 2017-02-15 ENCOUNTER — Other Ambulatory Visit: Payer: Self-pay | Admitting: Orthopedic Surgery

## 2017-02-15 DIAGNOSIS — M25512 Pain in left shoulder: Secondary | ICD-10-CM

## 2017-02-16 ENCOUNTER — Other Ambulatory Visit: Payer: Medicare Other

## 2017-02-18 ENCOUNTER — Ambulatory Visit
Admission: RE | Admit: 2017-02-18 | Discharge: 2017-02-18 | Disposition: A | Discharge status: Home / Self Care | Payer: Medicare Other | Source: Ambulatory Visit | Attending: Orthopedic Surgery | Admitting: Orthopedic Surgery

## 2017-02-18 DIAGNOSIS — M25512 Pain in left shoulder: Secondary | ICD-10-CM

## 2017-03-05 ENCOUNTER — Telehealth: Payer: Self-pay | Admitting: *Deleted

## 2017-03-05 NOTE — Telephone Encounter (Signed)
Spoke with patient and advised that while we have not been able to get his home monitor to work using the Producer, television/film/video, the analog program through Med[employer] is ending.  This is estimated to occur on 03/24/17.  Advised patient that we will need to resume in-clinic ICD checks every three months as we now have no way to get his monitor connected at home (poor cell signal, no internet).  Patient verbalizes understanding and is agreeable to a Device Clinic appointment on 04/15/17 at 2:30pm.  He is appreciative of assistance and denies additional questions or concerns at this time.

## 2017-05-05 ENCOUNTER — Ambulatory Visit (INDEPENDENT_AMBULATORY_CARE_PROVIDER_SITE_OTHER): Payer: Medicare Other | Admitting: *Deleted

## 2017-05-05 DIAGNOSIS — Z95810 Presence of automatic (implantable) cardiac defibrillator: Secondary | ICD-10-CM | POA: Diagnosis not present

## 2017-05-05 DIAGNOSIS — I4901 Ventricular fibrillation: Secondary | ICD-10-CM | POA: Diagnosis not present

## 2017-05-05 DIAGNOSIS — I4729 Other ventricular tachycardia: Secondary | ICD-10-CM

## 2017-05-05 DIAGNOSIS — I472 Ventricular tachycardia: Secondary | ICD-10-CM

## 2017-05-05 LAB — CUP PACEART INCLINIC DEVICE CHECK
Battery Remaining Longevity: 129 mo
Battery Voltage: 3.01 V
Brady Statistic RV Percent Paced: 10.07 %
HIGH POWER IMPEDANCE MEASURED VALUE: 38 Ohm
HighPow Impedance: 49 Ohm
Implantable Lead Location: 753860
Implantable Lead Model: 6947
Lead Channel Impedance Value: 323 Ohm
Lead Channel Impedance Value: 399 Ohm
Lead Channel Pacing Threshold Amplitude: 0.75 V
Lead Channel Setting Pacing Amplitude: 2.5 V
Lead Channel Setting Pacing Pulse Width: 0.4 ms
Lead Channel Setting Sensing Sensitivity: 0.3 mV
MDC IDC LEAD IMPLANT DT: 20110209
MDC IDC MSMT LEADCHNL RV PACING THRESHOLD PULSEWIDTH: 0.4 ms
MDC IDC MSMT LEADCHNL RV SENSING INTR AMPL: 9 mV
MDC IDC PG IMPLANT DT: 20170316
MDC IDC SESS DTM: 20180718161045

## 2017-05-05 NOTE — Progress Notes (Signed)
ICD check in clinic. Normal device function. Thresholds and sensing consistent with previous device measurements. Impedance trends stable over time. No evidence of any ventricular arrhythmias. No mode switches. Histogram distribution appropriate for patient and level of activity. No changes made this session. Device programmed at appropriate safety margins; max RV pacing lead impedance alert increased to 2000ohms per protocol. Device programmed to optimize intrinsic conduction. Estimated longevity 10.7 years. Patient enrolled in remote follow-up, but has no cell signal and limited internet connectivity at home. Patient education completed including shock plan. Alert tones demonstrated for patient. ROV with Device Clinic on 08/04/17 and ROV with GT in 12/2017.

## 2017-09-24 ENCOUNTER — Telehealth: Payer: Self-pay

## 2017-09-24 NOTE — Telephone Encounter (Signed)
Lmtcb for patient to reschedule Monday 12/10 appt due to the office being closed for inclement weather. Offered patient appt for Friday 10/01/17 at the same time.

## 2017-11-04 ENCOUNTER — Ambulatory Visit (INDEPENDENT_AMBULATORY_CARE_PROVIDER_SITE_OTHER): Payer: Medicare Other | Admitting: *Deleted

## 2017-11-04 DIAGNOSIS — I472 Ventricular tachycardia: Secondary | ICD-10-CM | POA: Diagnosis not present

## 2017-11-04 DIAGNOSIS — I4729 Other ventricular tachycardia: Secondary | ICD-10-CM

## 2017-11-04 LAB — CUP PACEART INCLINIC DEVICE CHECK
Battery Voltage: 3.01 V
Brady Statistic RV Percent Paced: 14.81 %
Date Time Interrogation Session: 20190117122817
HIGH POWER IMPEDANCE MEASURED VALUE: 38 Ohm
HighPow Impedance: 52 Ohm
Implantable Lead Implant Date: 20110209
Implantable Lead Location: 753860
Implantable Pulse Generator Implant Date: 20170316
Lead Channel Impedance Value: 399 Ohm
Lead Channel Setting Pacing Amplitude: 2.5 V
Lead Channel Setting Sensing Sensitivity: 0.3 mV
MDC IDC MSMT BATTERY REMAINING LONGEVITY: 125 mo
MDC IDC MSMT LEADCHNL RV IMPEDANCE VALUE: 342 Ohm
MDC IDC MSMT LEADCHNL RV PACING THRESHOLD AMPLITUDE: 0.75 V
MDC IDC MSMT LEADCHNL RV PACING THRESHOLD PULSEWIDTH: 0.4 ms
MDC IDC MSMT LEADCHNL RV SENSING INTR AMPL: 10.75 mV
MDC IDC SET LEADCHNL RV PACING PULSEWIDTH: 0.4 ms

## 2017-11-04 MED ORDER — FUROSEMIDE 20 MG PO TABS: 20.0000 mg | ORAL_TABLET | Freq: Every day | ORAL | 11 refills | Status: AC

## 2017-11-04 NOTE — Progress Notes (Signed)
ICD check in clinic. Normal device function. Thresholds and sensing consistent with previous device measurements. Impedance trends stable over time. No evidence of any ventricular arrhythmias. Histogram distribution appropriate for patient and level of activity. No changes made this session. Device programmed at appropriate safety margins. Optivol increased above baseline, but below threshold. Device programmed to optimize intrinsic conduction. Estimated longevity 10.4 years. Patient education completed including shock plan. ROV with GT 01/14/18.  Patient c/o of increased fatigue, increased edema in legs and SOB with exertion over the past 3 weeks. Patient states he sleeps for multiple hours to all day. Patient states 3 weeks ago he had really bad stomach pains and went to his doctor. Per patient, they found his gallbladder to be red on one side. Patient states taking medications as prescribed. Advised patient to make appointment with PCP. I will review with Dr. Ladona Ridgel and give them a call with any recommendations.

## 2017-11-09 ENCOUNTER — Telehealth: Payer: Self-pay | Admitting: *Deleted

## 2017-11-09 NOTE — Telephone Encounter (Signed)
Reviewed 11/04/17 encounter with Dr. Ladona Ridgel in regards to patient symptoms of increased fatigue, SOB with exertion, edema and stomach pains-- Dr. Ladona Ridgel recommends patient to follow-up with PCP at this time. Spoke with patient regarding recommendations. Patient verbalized understanding and states he will call his PCP to make an appointment.

## 2018-01-14 ENCOUNTER — Encounter: Payer: Self-pay | Admitting: Internal Medicine

## 2018-01-14 ENCOUNTER — Ambulatory Visit (INDEPENDENT_AMBULATORY_CARE_PROVIDER_SITE_OTHER): Payer: Medicare Other | Admitting: Internal Medicine

## 2018-01-14 VITALS — BP 142/70 | HR 48 | Ht 71.0 in | Wt 217.0 lb

## 2018-01-14 DIAGNOSIS — I4901 Ventricular fibrillation: Secondary | ICD-10-CM

## 2018-01-14 DIAGNOSIS — Z95810 Presence of automatic (implantable) cardiac defibrillator: Secondary | ICD-10-CM

## 2018-01-14 DIAGNOSIS — I482 Chronic atrial fibrillation, unspecified: Secondary | ICD-10-CM

## 2018-01-14 LAB — CUP PACEART INCLINIC DEVICE CHECK
Battery Remaining Longevity: 124 mo
Date Time Interrogation Session: 20190329140107
HIGH POWER IMPEDANCE MEASURED VALUE: 49 Ohm
HighPow Impedance: 37 Ohm
Implantable Lead Implant Date: 20110209
Implantable Lead Location: 753860
Implantable Pulse Generator Implant Date: 20170316
Lead Channel Impedance Value: 342 Ohm
Lead Channel Impedance Value: 437 Ohm
Lead Channel Pacing Threshold Amplitude: 0.75 V
Lead Channel Pacing Threshold Pulse Width: 0.4 ms
Lead Channel Sensing Intrinsic Amplitude: 10 mV
Lead Channel Setting Pacing Amplitude: 2.5 V
Lead Channel Setting Pacing Pulse Width: 0.4 ms
MDC IDC MSMT BATTERY VOLTAGE: 3.01 V
MDC IDC MSMT LEADCHNL RV SENSING INTR AMPL: 8.375 mV
MDC IDC SET LEADCHNL RV SENSING SENSITIVITY: 0.3 mV
MDC IDC STAT BRADY RV PERCENT PACED: 17.38 %

## 2018-01-14 MED ORDER — AMLODIPINE BESYLATE 5 MG PO TABS: 5.0000 mg | ORAL_TABLET | Freq: Every day | ORAL | 3 refills | Status: DC

## 2018-01-14 NOTE — Progress Notes (Signed)
HPI Jeremy Cobb returns today for followup. He is a very pleasant 84 year old man with a history of resuscitated ventricular fibrillation cardiac arrest. He is status post ICD implantation. He has a history of aortic insufficiency and coronary disease. He has relatively preserved left ventricular function. He has atrial fibrillation. When I saw the patient last, he had stopped his amiodarone and had reverted back to atrial fibrillation. Since then he has been in atrial fibrillation 100% of the time. He denies syncope, chest pain, but has had some shortness of breath. No ICD shock. He has had no bleeding on Eliquis. His energy level is a little low.   Allergies  Allergen Reactions  . Ivp Dye [Iodinated Diagnostic Agents] Other (See Comments)    Sent patient into a coma  . Tuberculin Tests Swelling  . Oxycodone Other (See Comments)    dizziness     Current Outpatient Medications  Medication Sig Dispense Refill  . acetaminophen (TYLENOL) 500 MG tablet Take 500 mg by mouth at bedtime.    Marland Kitchen apixaban (ELIQUIS) 5 MG TABS tablet Take 5 mg by mouth 2 (two) times daily.    . CRESTOR 20 MG tablet Take 20 mg by mouth daily before breakfast.    . diltiazem (CARDIZEM CD) 180 MG 24 hr capsule Take 180 mg by mouth daily.  0  . ergocalciferol (VITAMIN D2) 50000 UNITS capsule Take 50,000 Units by mouth once a week. FRIDAY    . furosemide (LASIX) 20 MG tablet Take 1 tablet (20 mg total) by mouth daily. May take one additional tablet daily as needed for shortness of breath or swelling 45 tablet 11  . GLIPIZIDE XL 5 MG 24 hr tablet Take 5 mg by mouth daily.    Marland Kitchen HYDROcodone-acetaminophen (NORCO/VICODIN) 5-325 MG per tablet Take 0.5 tablets by mouth every 6 (six) hours as needed for moderate pain (1/2 tab as needed).     Marland Kitchen levothyroxine (SYNTHROID, LEVOTHROID) 88 MCG tablet Take 88 mcg by mouth daily.    Marland Kitchen omeprazole (PRILOSEC) 40 MG capsule Take 40 mg by mouth daily.      No current  facility-administered medications for this visit.      Past Medical History:  Diagnosis Date  . Asbestosis(501)   . Atrial fibrillation (HCC)   . CAD (coronary artery disease)    nonobstructive  . Degeneration of intervertebral disc, site unspecified   . Diseases of tricuspid valve   . Esophageal reflux   . Mitral valve insufficiency and aortic valve insufficiency   . Obstructive sleep apnea (adult) (pediatric)   . Osteoarthrosis, unspecified whether generalized or localized, unspecified site   . Other and unspecified hyperlipidemia   . Other primary cardiomyopathies    presumed noischemic  . Type II or unspecified type diabetes mellitus without mention of complication, not stated as uncontrolled   . Unspecified disorder of kidney and ureter   . Unspecified hypothyroidism     ROS:   All systems reviewed and negative except as noted in the HPI.   Past Surgical History:  Procedure Laterality Date  . EP IMPLANTABLE DEVICE N/A 01/02/2016   Procedure: ICD/BIV ICD Generator Changeout;  Surgeon: Marinus Maw, MD;  Location: [facility] INVASIVE CV LAB;  Service: Cardiovascular;  Laterality: N/A;  . right total knee arthroplasty  Nov 18, 2009     Family History  Problem Relation Age of Onset  . Diabetes Father   . Stroke Father   . Diabetes Sister   .  Anuerysm Brother      Social History   Socioeconomic History  . Marital status: Married    Spouse name: Not on file  . Number of children: Not on file  . Years of education: Not on file  . Highest education level: Not on file  Occupational History  . Not on file  Social Needs  . Financial resource strain: Not on file  . Food insecurity:    Worry: Not on file    Inability: Not on file  . Transportation needs:    Medical: Not on file    Non-medical: Not on file  Tobacco Use  . Smoking status: Never Smoker  . Smokeless tobacco: Never Used  Substance and Sexual Activity  . Alcohol use: Not on file  . Drug use: Not on  file  . Sexual activity: Not on file  Lifestyle  . Physical activity:    Days per week: Not on file    Minutes per session: Not on file  . Stress: Not on file  Relationships  . Social connections:    Talks on phone: Not on file    Gets together: Not on file    Attends religious service: Not on file    Active member of club or organization: Not on file    Attends meetings of clubs or organizations: Not on file    Relationship status: Not on file  . Intimate partner violence:    Fear of current or ex partner: Not on file    Emotionally abused: Not on file    Physically abused: Not on file    Forced sexual activity: Not on file  Other Topics Concern  . Not on file  Social History Narrative   No ETOH or tobacco abuse.     BP (!) 142/70   Pulse (!) 48   Ht 5\' 11"  (1.803 m)   Wt 217 lb (98.4 kg)   SpO2 97%   BMI 30.27 kg/m   Physical Exam:  Well appearing NAD HEENT: Unremarkable Neck:  No JVD, no thyromegally Lymphatics:  No adenopathy Back:  No CVA tenderness Lungs:  Clear with no wheezes HEART:  Regular rate rhythm, no murmurs, no rubs, no clicks Abd:  soft, positive bowel sounds, no organomegally, no rebound, no guarding Ext:  2 plus pulses, no edema, no cyanosis, no clubbing Skin:  No rashes no nodules Neuro:  CN II through XII intact, motor grossly intact  EKG - atrial fib with a slow VR  DEVICE  Normal device function.  See PaceArt for details.   Assess/Plan: 1. Atrial fib - his ventricular rate is too well controlled. He will stop cardizem and switch to amlodipine. 2. VT/VF - he has had no recurrent ventricular arrhythmias. 3. ICD - his Medtronic device is working normally. Will recheck in several months. 4. CAD - he remains active and has no angina.   Jeremy Cobb.D.

## 2018-01-14 NOTE — Patient Instructions (Addendum)
Medication Instructions:  Your physician has recommended you make the following change in your medication:  1.  Stop cardizem 2.  Start taking amlodipine 5 mg one tablet by mouth daily.  Labwork: None ordered.  Testing/Procedures: None ordered.  Follow-Up: Your physician wants you to follow-up in: 6 months with the device clinic.  Your physician wants you to follow-up in: one year with Dr. Ladona Ridgel.   You will receive a reminder letter in the mail two months in advance. If you don't receive a letter, please call our office to schedule the follow-up appointment.  Any Other Special Instructions Will Be Listed Below (If Applicable).  If you need a refill on your cardiac medications before your next appointment, please call your pharmacy.

## 2018-02-23 ENCOUNTER — Telehealth: Payer: Self-pay | Admitting: Internal Medicine

## 2018-02-23 NOTE — Telephone Encounter (Signed)
Call returned to Pt.  Pt having increased fatigue since starting amlodipine. Pt does not have BP cuff at home.  Asked Pt to pick up BP cuff to monitor BP and pulse. Advised Pt to take 1/2 tab of the amlodipine 5 mg daily until next week.  If Pt still having fatigue next week will explore medication options. Pt indicates understanding and agreeable with plan.  Pt will pick up BP cuff next time he is out. Will return call next week.

## 2018-02-23 NOTE — Telephone Encounter (Signed)
New Message   Pt c/o medication issue:  1. Name of Medication: amLODipine (NORVASC) 5 MG tablet  2. How are you currently taking this medication (dosage and times per day)?   3. Are you having a reaction (difficulty breathing--STAT)? fatigue  4. What is your medication issue? Patient states since beginning medication that he has been more fatigued. He says he just wants to sleep. Please call.

## 2018-03-01 NOTE — Telephone Encounter (Signed)
Called Pt.  Pt has been taking 2.5 mg amlodipine daily for last week.  Pt has not noticed an improvement in his energy level.  Pt states his BP running 148/84.  Asked Pt to stop taking his amlodipine, will call him on Monday.  If still fatigued will know not his amlodipine causing it.  Advised Pt to closely monitor BP during this time and call if it gets higher than it has been running. Pt indicates understanding. Will call Monday.

## 2018-03-07 NOTE — Telephone Encounter (Signed)
Call placed to Pt to follow up on amlodipine.  Per wife Pt hospitalized at this time for Cdiff. [facility]? Advised wife this nurse would call her next week to check in with her and her husband.

## 2018-03-19 DIAGNOSIS — K838 Other specified diseases of biliary tract: Secondary | ICD-10-CM | POA: Insufficient documentation

## 2018-03-19 DIAGNOSIS — K9189 Other postprocedural complications and disorders of digestive system: Secondary | ICD-10-CM

## 2018-03-29 DIAGNOSIS — Z9889 Other specified postprocedural states: Secondary | ICD-10-CM | POA: Insufficient documentation

## 2018-04-04 DIAGNOSIS — R55 Syncope and collapse: Secondary | ICD-10-CM | POA: Insufficient documentation

## 2018-04-04 DIAGNOSIS — J9601 Acute respiratory failure with hypoxia: Secondary | ICD-10-CM | POA: Insufficient documentation

## 2018-04-04 DIAGNOSIS — R410 Disorientation, unspecified: Secondary | ICD-10-CM | POA: Insufficient documentation

## 2018-04-04 DIAGNOSIS — D72829 Elevated white blood cell count, unspecified: Secondary | ICD-10-CM | POA: Insufficient documentation

## 2018-04-04 DIAGNOSIS — E8770 Fluid overload, unspecified: Secondary | ICD-10-CM | POA: Insufficient documentation

## 2018-04-06 DIAGNOSIS — Z4682 Encounter for fitting and adjustment of non-vascular catheter: Secondary | ICD-10-CM | POA: Insufficient documentation

## 2018-04-07 ENCOUNTER — Telehealth: Payer: Self-pay | Admitting: Internal Medicine

## 2018-04-07 MED ORDER — LEVOTHYROXINE SODIUM 100 MCG PO TABS
100.00 | ORAL_TABLET | ORAL | Status: DC
Start: 2018-04-07 — End: 2018-04-07

## 2018-04-07 MED ORDER — HYDROCODONE-ACETAMINOPHEN 5-325 MG PO TABS
1.00 | ORAL_TABLET | ORAL | Status: DC
Start: ? — End: 2018-04-07

## 2018-04-07 MED ORDER — SENNA-DOCUSATE SODIUM 8.6-50 MG PO TABS
1.00 | ORAL_TABLET | ORAL | Status: DC
Start: ? — End: 2018-04-07

## 2018-04-07 MED ORDER — FUROSEMIDE 10 MG/ML IJ SOLN
40.00 | INTRAMUSCULAR | Status: DC
Start: 2018-04-06 — End: 2018-04-07

## 2018-04-07 MED ORDER — MAGNESIUM HYDROXIDE 400 MG/5ML PO SUSP
30.00 | ORAL | Status: DC
Start: ? — End: 2018-04-07

## 2018-04-07 MED ORDER — APIXABAN 5 MG PO TABS
5.00 | ORAL_TABLET | ORAL | Status: DC
Start: 2018-04-06 — End: 2018-04-07

## 2018-04-07 MED ORDER — VITAMIN D (ERGOCALCIFEROL) 1.25 MG (50000 UNIT) PO CAPS
50000.00 | ORAL_CAPSULE | ORAL | Status: DC
Start: ? — End: 2018-04-07

## 2018-04-07 MED ORDER — DIGOXIN 125 MCG PO TABS
125.00 | ORAL_TABLET | ORAL | Status: DC
Start: 2018-04-07 — End: 2018-04-07

## 2018-04-07 MED ORDER — PANTOPRAZOLE SODIUM 40 MG PO TBEC
40.00 | DELAYED_RELEASE_TABLET | ORAL | Status: DC
Start: 2018-04-06 — End: 2018-04-07

## 2018-04-07 MED ORDER — LABETALOL HCL 5 MG/ML IV SOLN
10.00 | INTRAVENOUS | Status: DC
Start: ? — End: 2018-04-07

## 2018-04-07 MED ORDER — POTASSIUM CHLORIDE CRYS ER 20 MEQ PO TBCR
20.00 | EXTENDED_RELEASE_TABLET | ORAL | Status: DC
Start: 2018-04-06 — End: 2018-04-07

## 2018-04-07 MED ORDER — DILTIAZEM HCL ER COATED BEADS 180 MG PO CP24
180.00 | ORAL_CAPSULE | ORAL | Status: DC
Start: 2018-04-07 — End: 2018-04-07

## 2018-04-07 MED ORDER — SODIUM CHLORIDE 0.9 % IV SOLN
10.00 | INTRAVENOUS | Status: DC
Start: ? — End: 2018-04-07

## 2018-04-07 MED ORDER — GENERIC EXTERNAL MEDICATION
Status: DC
Start: ? — End: 2018-04-07

## 2018-04-07 MED ORDER — ACETAMINOPHEN 650 MG RE SUPP
650.00 | RECTAL | Status: DC
Start: ? — End: 2018-04-07

## 2018-04-07 MED ORDER — ALUM & MAG HYDROXIDE-SIMETH 200-200-20 MG/5ML PO SUSP
30.00 | ORAL | Status: DC
Start: ? — End: 2018-04-07

## 2018-04-07 MED ORDER — ATORVASTATIN CALCIUM 80 MG PO TABS
80.00 | ORAL_TABLET | ORAL | Status: DC
Start: 2018-04-06 — End: 2018-04-07

## 2018-04-07 MED ORDER — FOLIC ACID 1 MG PO TABS
1.00 | ORAL_TABLET | ORAL | Status: DC
Start: 2018-04-07 — End: 2018-04-07

## 2018-04-07 MED ORDER — ACETAMINOPHEN 325 MG PO TABS
650.00 | ORAL_TABLET | ORAL | Status: DC
Start: ? — End: 2018-04-07

## 2018-04-07 NOTE — Telephone Encounter (Signed)
Returned call to family. Pt is in [facility], would like to change to an electrophysiology with Ren[facility] Butters for ease/coordination of care. Family would like a recommendation from Dr. Ladona Ridgel and they want him to know they are not changing because they don't appreciate him. Will speak with Dr. Ladona Ridgel and advise.

## 2018-04-07 NOTE — Telephone Encounter (Signed)
New message   Patient's spouse calling to discuss recent hospitalization with [facility]. Requesting call from Dr Ladona Ridgel

## 2018-04-08 NOTE — Telephone Encounter (Signed)
Returned call to Pt.  Unable to leave message.  Advise Dr. Ladona Ridgel recommends Clovis Riley Norwood Levo, MD Or Alwyn Pea, MD  Will attempt to call again.

## 2018-04-08 NOTE — Telephone Encounter (Signed)
No further action needed on this thread.  Closing.

## 2018-04-11 NOTE — Telephone Encounter (Signed)
Spoke with wife.  Advised of doctors recommended by Dr. Ladona Ridgel. Wife indicates understanding.

## 2018-05-11 ENCOUNTER — Telehealth: Payer: Self-pay | Admitting: Internal Medicine

## 2018-05-11 NOTE — Telephone Encounter (Signed)
I spoke with pt.  He reports swelling in feet and ankles has been present for 3-4 months.  Has been hospitalized twice since last visit here in March.  States he saw nurse recently at [facility].  Is going to change to [facility] for ICD follow up but pt states they cannot see him until November.  He states he was advised to contact our office to see Dr. Ladona Ridgel and has his ICD checked.  I offered pt appointment tomorrow with Francis Dowse PA but he is seeing primary care tomorrow and cannot be here for appointment. I told pt I would send message to Dr. Lubertha Basque nurse Boneta Lucks) regarding follow up appointment.  Pt aware Boneta Lucks is not in office this week.

## 2018-05-11 NOTE — Telephone Encounter (Signed)
New message:       Pt c/o swelling: STAT is pt has developed SOB within 24 hours  1) How much weight have you gained and in what time span? Pt hasn't weighed  2) If swelling, where is the swelling located? Legs and feet  3) Are you currently taking a fluid pill? Yes  4) Are you currently SOB?   5) Do you have a log of your daily weights (if so, list)? No  6) Have you gained 3 pounds in a day or 5 pounds in a week?  7) Have you traveled recently? No    Pt states he has been in and out of the hospital and they advised for him to come and get a defib check with Ladona Ridgel.

## 2018-05-17 NOTE — Telephone Encounter (Signed)
Appt made for August 16

## 2018-06-03 ENCOUNTER — Encounter (INDEPENDENT_AMBULATORY_CARE_PROVIDER_SITE_OTHER): Payer: Self-pay

## 2018-06-03 ENCOUNTER — Ambulatory Visit (INDEPENDENT_AMBULATORY_CARE_PROVIDER_SITE_OTHER): Payer: Medicare Other | Admitting: Internal Medicine

## 2018-06-03 ENCOUNTER — Encounter: Payer: Self-pay | Admitting: Internal Medicine

## 2018-06-03 VITALS — BP 132/82 | HR 79 | Ht 71.0 in | Wt 198.0 lb

## 2018-06-03 DIAGNOSIS — J8410 Pulmonary fibrosis, unspecified: Secondary | ICD-10-CM | POA: Insufficient documentation

## 2018-06-03 DIAGNOSIS — I482 Chronic atrial fibrillation, unspecified: Secondary | ICD-10-CM

## 2018-06-03 DIAGNOSIS — I4901 Ventricular fibrillation: Secondary | ICD-10-CM | POA: Diagnosis not present

## 2018-06-03 DIAGNOSIS — K760 Fatty (change of) liver, not elsewhere classified: Secondary | ICD-10-CM | POA: Insufficient documentation

## 2018-06-03 DIAGNOSIS — K635 Polyp of colon: Secondary | ICD-10-CM | POA: Insufficient documentation

## 2018-06-03 DIAGNOSIS — Z95810 Presence of automatic (implantable) cardiac defibrillator: Secondary | ICD-10-CM

## 2018-06-03 NOTE — Patient Instructions (Addendum)
Medication Instructions:  Your physician recommends that you continue on your current medications as directed. Please refer to the Current Medication list given to you today.  Labwork: None ordered.  Testing/Procedures: None ordered.  Follow-Up: Your physician wants you to follow-up in: as needed with Dr. Taylor.      Any Other Special Instructions Will Be Listed Below (If Applicable).  If you need a refill on your cardiac medications before your next appointment, please call your pharmacy.   

## 2018-06-03 NOTE — Progress Notes (Signed)
HPI [name] returns today for followup. He is a very pleasant 84 year old man with a history of resuscitated ventricular fibrillation cardiac arrest. He is status post ICD implantation. He has a history of aortic insufficiency and coronary disease. He has relatively preserved left ventricular function. He has atrial fibrillation. When I saw the patient last, he had stopped his amiodarone and had reverted back to atrial fibrillation. Since then he has been in atrial fibrillation 100% of the time. He denies syncope, chest pain,but has had someshortness of breath. No ICD shock. He has had no bleeding on Eliquis. His energy level is a little low. He has had GI problems and undergone ERCP. Because of being closer to [facility], he is scheduled to followup with Dr. Clovis Riley. No ICD shocks.  Allergies  Allergen Reactions  . Iodinated Diagnostic Agents Other (See Comments) and Anaphylaxis    Sent patient into a coma Sent patient into a coma  . Aplisol  [Tuberculin Ppd] Rash  . Oxycodone Other (See Comments)    dizziness Other reaction(s): Other dizziness  . Tuberculin Tests Swelling     Current Outpatient Medications  Medication Sig Dispense Refill  . acetaminophen (TYLENOL) 500 MG tablet Take 500 mg by mouth at bedtime.    Marland Kitchen apixaban (ELIQUIS) 5 MG TABS tablet Take 5 mg by mouth 2 (two) times daily.    . CRESTOR 20 MG tablet Take 20 mg by mouth daily before breakfast.    . Cyanocobalamin (VITAMIN B-12 CR) 1000 MCG TBCR DISSOLVE 1 TABLET UNDER THE TONGUE DAILY  3  . digoxin (LANOXIN) 0.125 MG tablet Take 125 mcg by mouth daily.    . ergocalciferol (VITAMIN D2) 50000 UNITS capsule Take 50,000 Units by mouth once a week. FRIDAY    . folic acid (FOLVITE) 1 MG tablet Take 1 mg by mouth daily.    . furosemide (LASIX) 20 MG tablet Take 1 tablet (20 mg total) by mouth daily. May take one additional tablet daily as needed for shortness of breath or swelling 45 tablet 11  . GLIPIZIDE XL 5 MG 24  hr tablet Take 5 mg by mouth daily.    Marland Kitchen HYDROcodone-acetaminophen (NORCO/VICODIN) 5-325 MG per tablet Take 0.5 tablets by mouth every 6 (six) hours as needed for moderate pain (1/2 tab as needed).     Marland Kitchen levothyroxine (SYNTHROID, LEVOTHROID) 100 MCG tablet Take 100 mcg by mouth daily.  0  . omeprazole (PRILOSEC) 40 MG capsule Take 40 mg by mouth daily.     . potassium chloride SA (K-DUR,KLOR-CON) 20 MEQ tablet Take 20 mEq by mouth daily.    . sennosides-docusate sodium (SENOKOT-S) 8.6-50 MG tablet Take 2 tablets by mouth 2 (two) times daily.    Marland Kitchen spironolactone (ALDACTONE) 25 MG tablet spironolactone 25 mg tablet  1 tab po 30 min prior to lasix daily     No current facility-administered medications for this visit.      Past Medical History:  Diagnosis Date  . Asbestosis(501)   . Atrial fibrillation (HCC)   . CAD (coronary artery disease)    nonobstructive  . Coronary atherosclerosis 03/03/2010   Qualifier: Diagnosis of  By: Denyse Amass CMA, Okey Regal    . Degeneration of intervertebral disc, site unspecified   . Diseases of tricuspid valve   . Dizziness 08/15/2015  . Esophageal reflux   . Mitral valve insufficiency and aortic valve insufficiency   . Noise effect on inner ear 01/29/2015  . Obstructive sleep apnea (adult) (pediatric)   .  Osteoarthrosis, unspecified whether generalized or localized, unspecified site   . Other and unspecified hyperlipidemia   . Other primary cardiomyopathies    presumed noischemic  . Type II or unspecified type diabetes mellitus without mention of complication, not stated as uncontrolled   . Unspecified disorder of kidney and ureter   . Unspecified hypothyroidism   . VENTRICULAR TACHYCARDIA, PAROXYSMAL 06/26/2010   Qualifier: Diagnosis of  By: Ladona Ridgel, MD, Jerrell Mylar     ROS:   All systems reviewed and negative except as noted in the HPI.   Past Surgical History:  Procedure Laterality Date  . EP IMPLANTABLE DEVICE N/A 01/02/2016   Procedure:  ICD/BIV ICD Generator Changeout;  Surgeon: Marinus Maw, MD;  Location: [facility] INVASIVE CV LAB;  Service: Cardiovascular;  Laterality: N/A;  . right total knee arthroplasty  Nov 18, 2009     Family History  Problem Relation Age of Onset  . Diabetes Father   . Stroke Father   . Diabetes Sister   . Anuerysm Brother      Social History   Socioeconomic History  . Marital status: Married    Spouse name: Not on file  . Number of children: Not on file  . Years of education: Not on file  . Highest education level: Not on file  Occupational History  . Not on file  Social Needs  . Financial resource strain: Not on file  . Food insecurity:    Worry: Not on file    Inability: Not on file  . Transportation needs:    Medical: Not on file    Non-medical: Not on file  Tobacco Use  . Smoking status: Never Smoker  . Smokeless tobacco: Never Used  Substance and Sexual Activity  . Alcohol use: Not on file  . Drug use: Not on file  . Sexual activity: Not on file  Lifestyle  . Physical activity:    Days per week: Not on file    Minutes per session: Not on file  . Stress: Not on file  Relationships  . Social connections:    Talks on phone: Not on file    Gets together: Not on file    Attends religious service: Not on file    Active member of club or organization: Not on file    Attends meetings of clubs or organizations: Not on file    Relationship status: Not on file  . Intimate partner violence:    Fear of current or ex partner: Not on file    Emotionally abused: Not on file    Physically abused: Not on file    Forced sexual activity: Not on file  Other Topics Concern  . Not on file  Social History Narrative   No ETOH or tobacco abuse.     BP 132/82   Pulse 79   Ht 5\' 11"  (1.803 m)   Wt 198 lb (89.8 kg)   BMI 27.62 kg/m   Physical Exam:  Well appearing NAD HEENT: Unremarkable Neck:  No JVD, no thyromegally Lymphatics:  No adenopathy Back:  No CVA  tenderness Lungs:  Clear with no wheezes HEART:  Regular rate rhythm, no murmurs, no rubs, no clicks Abd:  soft, positive bowel sounds, no organomegally, no rebound, no guarding Ext:  2 plus pulses, no edema, no cyanosis, no clubbing Skin:  No rashes no nodules Neuro:  CN II through XII intact, motor grossly intact  EKG - atrial fib with a controlled VR  DEVICE  Normal device function.  See PaceArt for details.   Assess/Plan: 1. VF arrest - he has had no additional ICD shocks. No change in meds. 2. Atrial fib - this is chronic and he has failed amio. He will continue rate control. 3. ICD - his Medtronic ICD is working normally. We will see him back as needed. He plans to followup with Dr. Clovis Riley.  4. Chronic diastolic heart failure - his symptoms are class 2. He will continue his current meds. He has had some renal insufficiency

## 2018-06-04 LAB — CUP PACEART INCLINIC DEVICE CHECK
Battery Remaining Longevity: 121 mo
Battery Voltage: 3.01 V
Brady Statistic RV Percent Paced: 2.62 %
HIGH POWER IMPEDANCE MEASURED VALUE: 35 Ohm
HIGH POWER IMPEDANCE MEASURED VALUE: 52 Ohm
Implantable Lead Location: 753860
Implantable Lead Model: 6947
Implantable Pulse Generator Implant Date: 20170316
Lead Channel Impedance Value: 342 Ohm
Lead Channel Impedance Value: 399 Ohm
Lead Channel Pacing Threshold Pulse Width: 0.4 ms
Lead Channel Setting Pacing Amplitude: 2.5 V
Lead Channel Setting Pacing Pulse Width: 0.4 ms
Lead Channel Setting Sensing Sensitivity: 0.3 mV
MDC IDC LEAD IMPLANT DT: 20110209
MDC IDC MSMT LEADCHNL RV PACING THRESHOLD AMPLITUDE: 0.75 V
MDC IDC MSMT LEADCHNL RV SENSING INTR AMPL: 8.625 mV
MDC IDC SESS DTM: 20190816143222

## 2019-01-29 ENCOUNTER — Other Ambulatory Visit: Payer: Self-pay | Admitting: Internal Medicine

## 2019-02-05 ENCOUNTER — Other Ambulatory Visit: Payer: Self-pay | Admitting: Internal Medicine

## 2019-06-20 ENCOUNTER — Other Ambulatory Visit (HOSPITAL_COMMUNITY): Payer: Self-pay | Admitting: Orthopedic Surgery

## 2019-06-20 DIAGNOSIS — M25511 Pain in right shoulder: Secondary | ICD-10-CM

## 2019-06-28 ENCOUNTER — Other Ambulatory Visit: Payer: Self-pay | Admitting: Orthopedic Surgery

## 2019-06-28 ENCOUNTER — Other Ambulatory Visit (HOSPITAL_COMMUNITY): Payer: Self-pay | Admitting: Orthopedic Surgery

## 2019-06-28 DIAGNOSIS — M25511 Pain in right shoulder: Secondary | ICD-10-CM

## 2019-07-04 ENCOUNTER — Other Ambulatory Visit (HOSPITAL_COMMUNITY): Payer: Self-pay | Admitting: Orthopedic Surgery

## 2019-07-10 ENCOUNTER — Encounter (HOSPITAL_COMMUNITY): Payer: Self-pay

## 2019-07-10 ENCOUNTER — Ambulatory Visit (HOSPITAL_COMMUNITY): Payer: Medicare Other

## 2019-07-17 ENCOUNTER — Other Ambulatory Visit (HOSPITAL_COMMUNITY): Payer: Medicare Other

## 2019-07-17 ENCOUNTER — Encounter (HOSPITAL_COMMUNITY): Payer: Self-pay

## 2020-05-07 ENCOUNTER — Other Ambulatory Visit: Payer: Self-pay | Admitting: Orthopedic Surgery

## 2020-05-07 DIAGNOSIS — M25511 Pain in right shoulder: Secondary | ICD-10-CM

## 2020-05-21 ENCOUNTER — Ambulatory Visit
Admission: RE | Admit: 2020-05-21 | Discharge: 2020-05-21 | Disposition: A | Discharge status: Home / Self Care | Payer: Medicare Other | Source: Ambulatory Visit | Attending: Orthopedic Surgery | Admitting: Orthopedic Surgery

## 2020-05-21 DIAGNOSIS — M25511 Pain in right shoulder: Secondary | ICD-10-CM
